# Patient Record
Sex: Female | Born: 1960 | Race: Black or African American | Hispanic: No | Marital: Single | Smoking: Former smoker
Health system: Southern US, Community
[De-identification: ages and names within clinical notes are randomized; demographics above are authoritative.]

## PROBLEM LIST (undated history)

## (undated) DIAGNOSIS — F329 Major depressive disorder, single episode, unspecified: Secondary | ICD-10-CM

## (undated) DIAGNOSIS — I10 Essential (primary) hypertension: Secondary | ICD-10-CM

## (undated) DIAGNOSIS — R002 Palpitations: Secondary | ICD-10-CM

## (undated) DIAGNOSIS — N183 Chronic kidney disease, stage 3 unspecified: Secondary | ICD-10-CM

## (undated) DIAGNOSIS — G4730 Sleep apnea, unspecified: Secondary | ICD-10-CM

## (undated) DIAGNOSIS — J45909 Unspecified asthma, uncomplicated: Secondary | ICD-10-CM

## (undated) DIAGNOSIS — E7800 Pure hypercholesterolemia, unspecified: Secondary | ICD-10-CM

## (undated) DIAGNOSIS — F32A Depression, unspecified: Secondary | ICD-10-CM

## (undated) DIAGNOSIS — J40 Bronchitis, not specified as acute or chronic: Secondary | ICD-10-CM

## (undated) HISTORY — PX: ABDOMINAL HYSTERECTOMY: SHX81

## (undated) HISTORY — PX: TONSILLECTOMY: SUR1361

---

## 2005-02-05 ENCOUNTER — Emergency Department: Payer: Self-pay | Admitting: Emergency Medicine

## 2005-02-05 ENCOUNTER — Other Ambulatory Visit: Payer: Self-pay

## 2005-03-06 ENCOUNTER — Emergency Department: Payer: Self-pay | Admitting: Emergency Medicine

## 2006-01-17 ENCOUNTER — Emergency Department: Payer: Self-pay | Admitting: Emergency Medicine

## 2006-01-18 ENCOUNTER — Ambulatory Visit: Payer: Self-pay | Admitting: Emergency Medicine

## 2006-04-16 ENCOUNTER — Emergency Department: Payer: Self-pay | Admitting: Internal Medicine

## 2008-10-30 ENCOUNTER — Inpatient Hospital Stay: Payer: Self-pay | Admitting: Internal Medicine

## 2009-05-01 ENCOUNTER — Emergency Department: Payer: Self-pay | Admitting: Emergency Medicine

## 2009-08-31 ENCOUNTER — Emergency Department: Payer: Self-pay | Admitting: Emergency Medicine

## 2010-07-04 ENCOUNTER — Emergency Department: Payer: Self-pay | Admitting: Emergency Medicine

## 2010-08-21 ENCOUNTER — Emergency Department: Payer: Self-pay | Admitting: Unknown Physician Specialty

## 2011-07-26 ENCOUNTER — Emergency Department: Payer: Self-pay | Admitting: *Deleted

## 2012-03-20 ENCOUNTER — Emergency Department: Payer: Self-pay | Admitting: Internal Medicine

## 2012-03-20 LAB — BASIC METABOLIC PANEL
Anion Gap: 11 (ref 7–16)
Creatinine: 1.55 mg/dL — ABNORMAL HIGH (ref 0.60–1.30)
EGFR (African American): 45 — ABNORMAL LOW
Glucose: 124 mg/dL — ABNORMAL HIGH (ref 65–99)
Osmolality: 289 (ref 275–301)
Potassium: 3.6 mmol/L (ref 3.5–5.1)

## 2012-03-20 LAB — CBC
HCT: 42.4 % (ref 35.0–47.0)
HGB: 13.8 g/dL (ref 12.0–16.0)
MCH: 27.7 pg (ref 26.0–34.0)
MCV: 85 fL (ref 80–100)
Platelet: 279 10*3/uL (ref 150–440)
RBC: 5 10*6/uL (ref 3.80–5.20)
RDW: 14.4 % (ref 11.5–14.5)

## 2012-11-29 ENCOUNTER — Emergency Department: Payer: Self-pay | Admitting: Emergency Medicine

## 2012-11-29 LAB — COMPREHENSIVE METABOLIC PANEL
Anion Gap: 9 (ref 7–16)
Bilirubin,Total: 0.2 mg/dL (ref 0.2–1.0)
Chloride: 106 mmol/L (ref 98–107)
Co2: 26 mmol/L (ref 21–32)
EGFR (African American): 44 — ABNORMAL LOW
EGFR (Non-African Amer.): 38 — ABNORMAL LOW
Glucose: 100 mg/dL — ABNORMAL HIGH (ref 65–99)
Potassium: 3.6 mmol/L (ref 3.5–5.1)
SGOT(AST): 35 U/L (ref 15–37)
SGPT (ALT): 29 U/L (ref 12–78)
Total Protein: 8.3 g/dL — ABNORMAL HIGH (ref 6.4–8.2)

## 2012-11-29 LAB — CBC WITH DIFFERENTIAL/PLATELET
Basophil #: 0.1 10*3/uL (ref 0.0–0.1)
Eosinophil %: 1.1 %
HCT: 46.3 % (ref 35.0–47.0)
HGB: 15.5 g/dL (ref 12.0–16.0)
Lymphocyte #: 2.7 10*3/uL (ref 1.0–3.6)
MCH: 27.8 pg (ref 26.0–34.0)
MCHC: 33.6 g/dL (ref 32.0–36.0)
MCV: 83 fL (ref 80–100)
Monocyte %: 10.6 %
RBC: 5.58 10*6/uL — ABNORMAL HIGH (ref 3.80–5.20)
WBC: 6.9 10*3/uL (ref 3.6–11.0)

## 2013-06-09 ENCOUNTER — Emergency Department: Payer: Self-pay | Admitting: Emergency Medicine

## 2013-09-04 ENCOUNTER — Emergency Department: Payer: Self-pay | Admitting: Internal Medicine

## 2013-09-04 LAB — COMPREHENSIVE METABOLIC PANEL
Alkaline Phosphatase: 89 U/L (ref 50–136)
Anion Gap: 9 (ref 7–16)
Bilirubin,Total: 0.2 mg/dL (ref 0.2–1.0)
Calcium, Total: 9.4 mg/dL (ref 8.5–10.1)
Chloride: 111 mmol/L — ABNORMAL HIGH (ref 98–107)
Co2: 21 mmol/L (ref 21–32)
Creatinine: 1.78 mg/dL — ABNORMAL HIGH (ref 0.60–1.30)
EGFR (African American): 38 — ABNORMAL LOW
Osmolality: 285 (ref 275–301)
SGOT(AST): 10 U/L — ABNORMAL LOW (ref 15–37)
Sodium: 141 mmol/L (ref 136–145)
Total Protein: 7.9 g/dL (ref 6.4–8.2)

## 2013-09-04 LAB — TROPONIN I: Troponin-I: <0.02 ng/mL

## 2013-09-04 LAB — CBC
HGB: 13.8 g/dL (ref 12.0–16.0)
MCH: 28.5 pg (ref 26.0–34.0)
Platelet: 273 10*3/uL (ref 150–440)
RBC: 4.86 10*6/uL (ref 3.80–5.20)

## 2013-09-04 LAB — ETHANOL
Ethanol %: <0.003 % (ref 0.000–0.080)
Ethanol: <3 mg/dL

## 2013-09-04 LAB — CK TOTAL AND CKMB (NOT AT ARMC): CK, Total: 97 U/L (ref 21–215)

## 2013-12-04 ENCOUNTER — Emergency Department: Payer: Self-pay | Admitting: Emergency Medicine

## 2013-12-04 LAB — TSH: THYROID STIMULATING HORM: 0.736 u[IU]/mL

## 2013-12-04 LAB — BASIC METABOLIC PANEL
Anion Gap: 10 (ref 7–16)
BUN: 39 mg/dL — ABNORMAL HIGH (ref 7–18)
CREATININE: 2.22 mg/dL — AB (ref 0.60–1.30)
Calcium, Total: 7.9 mg/dL — ABNORMAL LOW (ref 8.5–10.1)
Chloride: 113 mmol/L — ABNORMAL HIGH (ref 98–107)
Co2: 20 mmol/L — ABNORMAL LOW (ref 21–32)
EGFR (African American): 29 — ABNORMAL LOW
EGFR (Non-African Amer.): 25 — ABNORMAL LOW
Glucose: 115 mg/dL — ABNORMAL HIGH (ref 65–99)
Osmolality: 295 (ref 275–301)
Potassium: 3.5 mmol/L (ref 3.5–5.1)
SODIUM: 143 mmol/L (ref 136–145)

## 2013-12-04 LAB — CBC
HCT: 41.3 % (ref 35.0–47.0)
HGB: 13.7 g/dL (ref 12.0–16.0)
MCH: 28.4 pg (ref 26.0–34.0)
MCHC: 33.2 g/dL (ref 32.0–36.0)
MCV: 85 fL (ref 80–100)
Platelet: 244 10*3/uL (ref 150–440)
RBC: 4.83 10*6/uL (ref 3.80–5.20)
RDW: 14.2 % (ref 11.5–14.5)
WBC: 11 10*3/uL (ref 3.6–11.0)

## 2013-12-04 LAB — TROPONIN I: Troponin-I: <0.02 ng/mL

## 2013-12-14 ENCOUNTER — Emergency Department: Payer: Self-pay | Admitting: Emergency Medicine

## 2014-02-07 ENCOUNTER — Emergency Department: Payer: Self-pay | Admitting: Emergency Medicine

## 2014-02-07 LAB — COMPREHENSIVE METABOLIC PANEL
ALT: 29 U/L (ref 12–78)
AST: 21 U/L (ref 15–37)
Albumin: 3.7 g/dL (ref 3.4–5.0)
Alkaline Phosphatase: 78 U/L
Anion Gap: 3 — ABNORMAL LOW (ref 7–16)
BILIRUBIN TOTAL: 0.3 mg/dL (ref 0.2–1.0)
BUN: 18 mg/dL (ref 7–18)
CALCIUM: 9.3 mg/dL (ref 8.5–10.1)
CREATININE: 1.6 mg/dL — AB (ref 0.60–1.30)
Chloride: 111 mmol/L — ABNORMAL HIGH (ref 98–107)
Co2: 27 mmol/L (ref 21–32)
EGFR (African American): 43 — ABNORMAL LOW
GFR CALC NON AF AMER: 37 — AB
Glucose: 80 mg/dL (ref 65–99)
Osmolality: 282 (ref 275–301)
Potassium: 4 mmol/L (ref 3.5–5.1)
SODIUM: 141 mmol/L (ref 136–145)
Total Protein: 8.5 g/dL — ABNORMAL HIGH (ref 6.4–8.2)

## 2014-02-07 LAB — CBC
HCT: 41.4 % (ref 35.0–47.0)
HGB: 14 g/dL (ref 12.0–16.0)
MCH: 28.3 pg (ref 26.0–34.0)
MCHC: 33.7 g/dL (ref 32.0–36.0)
MCV: 84 fL (ref 80–100)
Platelet: 242 10*3/uL (ref 150–440)
RBC: 4.92 10*6/uL (ref 3.80–5.20)
RDW: 14.1 % (ref 11.5–14.5)
WBC: 8.9 10*3/uL (ref 3.6–11.0)

## 2014-02-07 LAB — PRO B NATRIURETIC PEPTIDE: B-Type Natriuretic Peptide: 540 pg/mL — ABNORMAL HIGH (ref 0–125)

## 2014-02-07 LAB — TROPONIN I: Troponin-I: <0.02 ng/mL

## 2014-04-24 ENCOUNTER — Emergency Department: Payer: Self-pay | Admitting: Emergency Medicine

## 2014-04-24 LAB — BASIC METABOLIC PANEL
ANION GAP: 3 — AB (ref 7–16)
BUN: 22 mg/dL — AB (ref 7–18)
CALCIUM: 9.4 mg/dL (ref 8.5–10.1)
CREATININE: 1.64 mg/dL — AB (ref 0.60–1.30)
Chloride: 109 mmol/L — ABNORMAL HIGH (ref 98–107)
Co2: 29 mmol/L (ref 21–32)
GFR CALC AF AMER: 42 — AB
GFR CALC NON AF AMER: 36 — AB
Glucose: 94 mg/dL (ref 65–99)
Osmolality: 284 (ref 275–301)
POTASSIUM: 4.1 mmol/L (ref 3.5–5.1)
Sodium: 141 mmol/L (ref 136–145)

## 2014-04-24 LAB — URINALYSIS, COMPLETE
Bacteria: NONE SEEN
Bilirubin,UR: NEGATIVE
Blood: NEGATIVE
Glucose,UR: NEGATIVE mg/dL (ref 0–75)
Ketone: NEGATIVE
Leukocyte Esterase: NEGATIVE
Nitrite: NEGATIVE
Ph: 7 (ref 4.5–8.0)
Protein: NEGATIVE
RBC,UR: <1 /HPF (ref 0–5)
Specific Gravity: 1.01 (ref 1.003–1.030)
Squamous Epithelial: 1
WBC UR: <1 /HPF (ref 0–5)

## 2014-04-24 LAB — CBC WITH DIFFERENTIAL/PLATELET
Basophil #: 0.1 10*3/uL (ref 0.0–0.1)
Basophil %: 1.1 %
Eosinophil #: 0.1 10*3/uL (ref 0.0–0.7)
Eosinophil %: 1.4 %
HCT: 41.4 % (ref 35.0–47.0)
HGB: 13.1 g/dL (ref 12.0–16.0)
Lymphocyte #: 3.1 10*3/uL (ref 1.0–3.6)
Lymphocyte %: 30.8 %
MCH: 26.6 pg (ref 26.0–34.0)
MCHC: 31.7 g/dL — ABNORMAL LOW (ref 32.0–36.0)
MCV: 84 fL (ref 80–100)
Monocyte #: 0.7 x10 3/mm (ref 0.2–0.9)
Monocyte %: 7.1 %
Neutrophil #: 5.9 10*3/uL (ref 1.4–6.5)
Neutrophil %: 59.6 %
Platelet: 281 10*3/uL (ref 150–440)
RBC: 4.92 10*6/uL (ref 3.80–5.20)
RDW: 15 % — ABNORMAL HIGH (ref 11.5–14.5)
WBC: 10 10*3/uL (ref 3.6–11.0)

## 2014-04-24 LAB — TROPONIN I: Troponin-I: <0.02 ng/mL

## 2014-06-15 ENCOUNTER — Emergency Department: Payer: Self-pay | Admitting: Emergency Medicine

## 2014-06-15 LAB — URINALYSIS, COMPLETE
Bacteria: NONE SEEN
Bilirubin,UR: NEGATIVE
Blood: NEGATIVE
GLUCOSE, UR: NEGATIVE mg/dL (ref 0–75)
Ketone: NEGATIVE
LEUKOCYTE ESTERASE: NEGATIVE
NITRITE: NEGATIVE
PH: 7 (ref 4.5–8.0)
Protein: NEGATIVE
RBC, UR: NONE SEEN /HPF (ref 0–5)
SPECIFIC GRAVITY: 1.008 (ref 1.003–1.030)
WBC UR: 1 /HPF (ref 0–5)

## 2014-06-15 LAB — CBC
HCT: 39.6 % (ref 35.0–47.0)
HGB: 12.9 g/dL (ref 12.0–16.0)
MCH: 27.8 pg (ref 26.0–34.0)
MCHC: 32.7 g/dL (ref 32.0–36.0)
MCV: 85 fL (ref 80–100)
Platelet: 278 10*3/uL (ref 150–440)
RBC: 4.66 10*6/uL (ref 3.80–5.20)
RDW: 14.9 % — AB (ref 11.5–14.5)
WBC: 6.9 10*3/uL (ref 3.6–11.0)

## 2014-06-15 LAB — COMPREHENSIVE METABOLIC PANEL
ALBUMIN: 3.2 g/dL — AB (ref 3.4–5.0)
ALK PHOS: 66 U/L
ALT: 20 U/L (ref 12–78)
ANION GAP: 6 — AB (ref 7–16)
BUN: 23 mg/dL — ABNORMAL HIGH (ref 7–18)
Bilirubin,Total: 0.4 mg/dL (ref 0.2–1.0)
CREATININE: 1.64 mg/dL — AB (ref 0.60–1.30)
Calcium, Total: 8.9 mg/dL (ref 8.5–10.1)
Chloride: 110 mmol/L — ABNORMAL HIGH (ref 98–107)
Co2: 25 mmol/L (ref 21–32)
EGFR (African American): 42 — ABNORMAL LOW
GFR CALC NON AF AMER: 36 — AB
Glucose: 86 mg/dL (ref 65–99)
Osmolality: 284 (ref 275–301)
Potassium: 4.3 mmol/L (ref 3.5–5.1)
SGOT(AST): 22 U/L (ref 15–37)
Sodium: 141 mmol/L (ref 136–145)
Total Protein: 8 g/dL (ref 6.4–8.2)

## 2014-06-15 LAB — TSH: THYROID STIMULATING HORM: 0.29 u[IU]/mL — AB

## 2014-06-15 LAB — T4, FREE: Free Thyroxine: 1.14 ng/dL (ref 0.76–1.46)

## 2014-06-15 LAB — PRO B NATRIURETIC PEPTIDE: B-Type Natriuretic Peptide: 69 pg/mL (ref 0–125)

## 2014-06-15 LAB — MAGNESIUM: Magnesium: 1.9 mg/dL

## 2014-06-15 LAB — TROPONIN I: Troponin-I: <0.02 ng/mL

## 2015-07-24 ENCOUNTER — Encounter: Payer: Self-pay | Admitting: Emergency Medicine

## 2015-07-24 ENCOUNTER — Emergency Department
Admission: EM | Admit: 2015-07-24 | Discharge: 2015-07-24 | Disposition: A | Discharge status: Home / Self Care | Payer: Medicaid Other | Attending: Emergency Medicine | Admitting: Emergency Medicine

## 2015-07-24 DIAGNOSIS — R040 Epistaxis: Secondary | ICD-10-CM | POA: Insufficient documentation

## 2015-07-24 DIAGNOSIS — Z87891 Personal history of nicotine dependence: Secondary | ICD-10-CM | POA: Diagnosis not present

## 2015-07-24 DIAGNOSIS — I10 Essential (primary) hypertension: Secondary | ICD-10-CM | POA: Diagnosis not present

## 2015-07-24 HISTORY — DX: Essential (primary) hypertension: I10

## 2015-07-24 HISTORY — DX: Sleep apnea, unspecified: G47.30

## 2015-07-24 NOTE — ED Notes (Addendum)
Pt reports cooking various foods in her kitchen when her right nostril began to bleed. Pt denies experiencing lightheadedness, dizziness or h/a. Pt reports experiencing "funny feeling in my chest". Pt reports hx of hypertension. Pt reports taking simvastatin and an ace inhibitor regularly for hypertension. Pt denies injury or trauma to nose. Pt denies loss of consciousness. Pt denies pain at this time.

## 2015-07-24 NOTE — ED Notes (Signed)
No injury, states takes aspirin a day, bleeding from R nare

## 2015-07-24 NOTE — ED Notes (Signed)
Right Nostril bleeding has stopped at this time. Pt reports minor bleeding from both nostrils 2 weeks ago, but did not come to the hospital because bleeding had stopped at home.

## 2015-07-24 NOTE — Discharge Instructions (Signed)
Nosebleed °A nosebleed can be caused by many things, including: °· Getting hit hard in the nose. °· Infections. °· Dry nose. °· Colds. °· Medicines. °Your doctor may do lab testing if you get nosebleeds a lot and the cause is not known. °HOME CARE  °· If your nose was packed with material, keep it there until your doctor takes it out. Put the pack back in your nose if the pack falls out. °· Do not blow your nose for 12 hours after the nosebleed. °· Sit up and bend forward if your nose starts bleeding again. Pinch the front half of your nose nonstop for 20 minutes. °· Put petroleum jelly inside your nose every morning if you have a dry nose. °· Use a humidifier to make the air less dry. °· Do not take aspirin. °· Try not to strain, lift, or bend at the waist for many days after the nosebleed. °GET HELP RIGHT AWAY IF:  °· Nosebleeds keep happening and are hard to stop or control. °· You have bleeding or bruises that are not normal on other parts of the body. °· You have a fever. °· The nosebleeds get worse. °· You get lightheaded, feel faint, sweaty, or throw up (vomit) blood. °MAKE SURE YOU:  °· Understand these instructions. °· Will watch your condition. °· Will get help right away if you are not doing well or get worse. °Document Released: 08/29/2008 Document Revised: 02/12/2012 Document Reviewed: 08/29/2008 °ExitCare® Patient Information ©2015 [employer]. This information is not intended to replace advice given to you by your health care provider. Make sure you discuss any questions you have with your health care provider. ° °

## 2015-07-24 NOTE — ED Provider Notes (Signed)
CSN: [id number]     Arrival date & time 07/24/15  1440 History   First MD Initiated Contact with Patient 07/24/15 1554     Chief Complaint  Patient presents with  . Epistaxis    began today     (Consider location/radiation/quality/duration/timing/severity/associated sxs/prior Treatment) HPI  54 year old female presents today for evaluation of right-sided epistasis. Bleeding began just prior to arrival. Patient states she was cooking, got hot and noticed trickling of blood down her face. Bleeding lasted for minutes. She was able to get complete relief with compression. She has had several nosebleeds over the last few months but usually they only last 2 minutes. She denies any headaches, blurred vision, abdominal pain, nausea, vomiting. She denies any trauma to the face. She denies any cough cold congestion runny nose. Patient is doing well currently and able to breathe in and out the nose without discomfort. Past Medical History  Diagnosis Date  . Hypertension   . Sleep apnea    Past Surgical History  Procedure Laterality Date  . Abdominal hysterectomy     No family history on file. Social History  Substance Use Topics  . Smoking status: Former Games developer  . Smokeless tobacco: None  . Alcohol Use: No   OB History    No data available     Review of Systems  Constitutional: Negative for fever, chills, activity change and fatigue.  HENT: Positive for nosebleeds. Negative for congestion, sinus pressure and sore throat.   Eyes: Negative for visual disturbance.  Respiratory: Negative for cough, chest tightness and shortness of breath.   Cardiovascular: Negative for chest pain and leg swelling.  Gastrointestinal: Negative for nausea, vomiting, abdominal pain and diarrhea.  Genitourinary: Negative for dysuria.  Musculoskeletal: Negative for arthralgias and gait problem.  Skin: Negative for rash.  Neurological: Negative for weakness, numbness and headaches.  Hematological: Negative for  adenopathy.  Psychiatric/Behavioral: Negative for behavioral problems, confusion and agitation.      Allergies  Review of patient's allergies indicates no known allergies.  Home Medications   Prior to Admission medications   Not on File   BP 138/86 mmHg  Pulse 110  Temp(Src) 98.1 F (36.7 C) (Oral)  Resp 20  Ht 5\' 4"  (1.626 m)  Wt 273 lb (123.832 kg)  BMI 46.84 kg/m2  SpO2 98%  LMP  Physical Exam  Constitutional: She is oriented to person, place, and time. She appears well-developed and well-nourished. No distress.  HENT:  Head: Normocephalic and atraumatic.  Nose: No mucosal edema, rhinorrhea, nose lacerations, sinus tenderness, nasal deformity, septal deviation or nasal septal hematoma. No epistaxis (minimal dried blood in the right nare).  No foreign bodies. Right sinus exhibits no maxillary sinus tenderness and no frontal sinus tenderness. Left sinus exhibits no maxillary sinus tenderness and no frontal sinus tenderness.  Mouth/Throat: Oropharynx is clear and moist. No oral lesions. No uvula swelling. No posterior oropharyngeal edema or posterior oropharyngeal erythema.  No bleeding in the pharyngeal region.  Eyes: EOM are normal. Pupils are equal, round, and reactive to light. Right eye exhibits no discharge. Left eye exhibits no discharge.  Neck: Normal range of motion. Neck supple.  Cardiovascular: Normal rate, regular rhythm and intact distal pulses.   Pulmonary/Chest: Effort normal and breath sounds normal. No respiratory distress. She exhibits no tenderness.  Abdominal: Soft. She exhibits no distension. There is no tenderness.  Musculoskeletal: Normal range of motion. She exhibits no edema.  Neurological: She is alert and oriented to person, place, and time. She  has normal reflexes.  Skin: Skin is warm and dry.  Psychiatric: She has a normal mood and affect. Her behavior is normal. Thought content normal.    ED Course  Procedures (including critical care  time) Labs Review Labs Reviewed - No data to display  Imaging Review No results found. I have personally reviewed and evaluated these images and lab results as part of my medical decision-making.   EKG Interpretation None      MDM   Final diagnoses:  Right-sided nosebleed    54 year old female right-sided epistasis. She was educated on nosebleeds, treatment and, red flags to return to the ER for. She was given a nasal clamp to use at home if bleeding occurs.    Evon Slack, PA-C 07/24/15 1623  Loleta Rose, MD 07/25/15 0005

## 2018-03-28 ENCOUNTER — Emergency Department (HOSPITAL_COMMUNITY): Payer: Medicaid Other

## 2018-03-28 ENCOUNTER — Emergency Department (HOSPITAL_COMMUNITY)
Admission: EM | Admit: 2018-03-28 | Discharge: 2018-03-28 | Discharge status: Against Medical Advice | Payer: Medicaid Other | Attending: Emergency Medicine | Admitting: Emergency Medicine

## 2018-03-28 ENCOUNTER — Encounter (HOSPITAL_COMMUNITY): Payer: Self-pay | Admitting: Emergency Medicine

## 2018-03-28 DIAGNOSIS — Z5321 Procedure and treatment not carried out due to patient leaving prior to being seen by health care provider: Secondary | ICD-10-CM | POA: Diagnosis not present

## 2018-03-28 DIAGNOSIS — R0602 Shortness of breath: Secondary | ICD-10-CM | POA: Diagnosis present

## 2018-03-28 LAB — CBC
HCT: 43.2 % (ref 36.0–46.0)
HEMOGLOBIN: 14.4 g/dL (ref 12.0–15.0)
MCH: 28.7 pg (ref 26.0–34.0)
MCHC: 33.3 g/dL (ref 30.0–36.0)
MCV: 86.2 fL (ref 78.0–100.0)
Platelets: 264 10*3/uL (ref 150–400)
RBC: 5.01 MIL/uL (ref 3.87–5.11)
RDW: 14.5 % (ref 11.5–15.5)
WBC: 6.2 10*3/uL (ref 4.0–10.5)

## 2018-03-28 LAB — I-STAT TROPONIN, ED: TROPONIN I, POC: 0 ng/mL (ref 0.00–0.08)

## 2018-03-28 LAB — BASIC METABOLIC PANEL
ANION GAP: 8 (ref 5–15)
BUN: 21 mg/dL — ABNORMAL HIGH (ref 6–20)
CALCIUM: 9.5 mg/dL (ref 8.9–10.3)
CO2: 25 mmol/L (ref 22–32)
Chloride: 107 mmol/L (ref 101–111)
Creatinine, Ser: 1.49 mg/dL — ABNORMAL HIGH (ref 0.44–1.00)
GFR calc Af Amer: 45 mL/min — ABNORMAL LOW (ref >60)
GFR, EST NON AFRICAN AMERICAN: 39 mL/min — AB (ref >60)
GLUCOSE: 96 mg/dL (ref 65–99)
POTASSIUM: 4.1 mmol/L (ref 3.5–5.1)
Sodium: 140 mmol/L (ref 135–145)

## 2018-03-28 LAB — I-STAT BETA HCG BLOOD, ED (MC, WL, AP ONLY): I-stat hCG, quantitative: <5 m[IU]/mL (ref <5)

## 2018-03-28 NOTE — ED Triage Notes (Signed)
Per [facility] pt from home where she has been out of power for 4 days. At night sleeps with cpap and hasnt been able to. Reporting SOB and anxiety since. Vitals: 136/90, 98HR, 98% on room

## 2018-03-28 NOTE — ED Notes (Signed)
[phone] [phone]

## 2018-03-28 NOTE — ED Notes (Signed)
Pt had drawn for labs:  Red Gold Blue Lavender Lt green Dark green x2 

## 2018-08-21 ENCOUNTER — Encounter (HOSPITAL_COMMUNITY): Payer: Self-pay | Admitting: Emergency Medicine

## 2018-08-21 ENCOUNTER — Other Ambulatory Visit: Payer: Self-pay

## 2018-08-21 ENCOUNTER — Observation Stay (HOSPITAL_COMMUNITY)
Admission: EM | Admit: 2018-08-21 | Discharge: 2018-08-22 | Disposition: A | Discharge status: Home / Self Care | Payer: Medicaid Other | Attending: Internal Medicine | Admitting: Internal Medicine

## 2018-08-21 ENCOUNTER — Emergency Department (HOSPITAL_COMMUNITY): Payer: Medicaid Other

## 2018-08-21 DIAGNOSIS — G4733 Obstructive sleep apnea (adult) (pediatric): Secondary | ICD-10-CM | POA: Insufficient documentation

## 2018-08-21 DIAGNOSIS — J45909 Unspecified asthma, uncomplicated: Secondary | ICD-10-CM | POA: Diagnosis not present

## 2018-08-21 DIAGNOSIS — Z79899 Other long term (current) drug therapy: Secondary | ICD-10-CM | POA: Diagnosis not present

## 2018-08-21 DIAGNOSIS — I129 Hypertensive chronic kidney disease with stage 1 through stage 4 chronic kidney disease, or unspecified chronic kidney disease: Secondary | ICD-10-CM | POA: Insufficient documentation

## 2018-08-21 DIAGNOSIS — M109 Gout, unspecified: Secondary | ICD-10-CM | POA: Diagnosis not present

## 2018-08-21 DIAGNOSIS — E785 Hyperlipidemia, unspecified: Secondary | ICD-10-CM | POA: Insufficient documentation

## 2018-08-21 DIAGNOSIS — E669 Obesity, unspecified: Secondary | ICD-10-CM | POA: Diagnosis not present

## 2018-08-21 DIAGNOSIS — E870 Hyperosmolality and hypernatremia: Secondary | ICD-10-CM | POA: Diagnosis not present

## 2018-08-21 DIAGNOSIS — I10 Essential (primary) hypertension: Secondary | ICD-10-CM

## 2018-08-21 DIAGNOSIS — Z87891 Personal history of nicotine dependence: Secondary | ICD-10-CM | POA: Insufficient documentation

## 2018-08-21 DIAGNOSIS — Z6841 Body Mass Index (BMI) 40.0 and over, adult: Secondary | ICD-10-CM | POA: Insufficient documentation

## 2018-08-21 DIAGNOSIS — F329 Major depressive disorder, single episode, unspecified: Secondary | ICD-10-CM | POA: Diagnosis not present

## 2018-08-21 DIAGNOSIS — E7800 Pure hypercholesterolemia, unspecified: Secondary | ICD-10-CM | POA: Insufficient documentation

## 2018-08-21 DIAGNOSIS — E1122 Type 2 diabetes mellitus with diabetic chronic kidney disease: Secondary | ICD-10-CM | POA: Insufficient documentation

## 2018-08-21 DIAGNOSIS — Z7982 Long term (current) use of aspirin: Secondary | ICD-10-CM | POA: Insufficient documentation

## 2018-08-21 DIAGNOSIS — E1169 Type 2 diabetes mellitus with other specified complication: Secondary | ICD-10-CM | POA: Diagnosis not present

## 2018-08-21 DIAGNOSIS — R079 Chest pain, unspecified: Principal | ICD-10-CM | POA: Diagnosis present

## 2018-08-21 DIAGNOSIS — Z9989 Dependence on other enabling machines and devices: Secondary | ICD-10-CM | POA: Diagnosis not present

## 2018-08-21 DIAGNOSIS — N183 Chronic kidney disease, stage 3 (moderate): Secondary | ICD-10-CM | POA: Insufficient documentation

## 2018-08-21 HISTORY — DX: Pure hypercholesterolemia, unspecified: E78.00

## 2018-08-21 HISTORY — DX: Unspecified asthma, uncomplicated: J45.909

## 2018-08-21 HISTORY — DX: Major depressive disorder, single episode, unspecified: F32.9

## 2018-08-21 HISTORY — DX: Bronchitis, not specified as acute or chronic: J40

## 2018-08-21 HISTORY — DX: Chronic kidney disease, stage 3 unspecified: N18.30

## 2018-08-21 HISTORY — DX: Depression, unspecified: F32.A

## 2018-08-21 HISTORY — DX: Morbid (severe) obesity due to excess calories: E66.01

## 2018-08-21 HISTORY — DX: Palpitations: R00.2

## 2018-08-21 HISTORY — DX: Chronic kidney disease, stage 3 (moderate): N18.3

## 2018-08-21 LAB — CBC
HCT: 41.4 % (ref 36.0–46.0)
HCT: 38.6 % (ref 36.0–46.0)
HEMOGLOBIN: 12.2 g/dL (ref 12.0–15.0)
Hemoglobin: 12.6 g/dL (ref 12.0–15.0)
MCH: 28.3 pg (ref 26.0–34.0)
MCH: 28.2 pg (ref 26.0–34.0)
MCHC: 30.4 g/dL (ref 30.0–36.0)
MCHC: 31.6 g/dL (ref 30.0–36.0)
MCV: 93 fL (ref 78.0–100.0)
MCV: 89.4 fL (ref 78.0–100.0)
Platelets: 202 10*3/uL (ref 150–400)
Platelets: 212 10*3/uL (ref 150–400)
RBC: 4.45 MIL/uL (ref 3.87–5.11)
RBC: 4.32 MIL/uL (ref 3.87–5.11)
RDW: 15 % (ref 11.5–15.5)
RDW: 14.8 % (ref 11.5–15.5)
WBC: 7.4 10*3/uL (ref 4.0–10.5)
WBC: 7 10*3/uL (ref 4.0–10.5)

## 2018-08-21 LAB — I-STAT TROPONIN, ED: TROPONIN I, POC: 0 ng/mL (ref 0.00–0.08)

## 2018-08-21 LAB — TROPONIN I: Troponin I: <0.03 ng/mL (ref <0.03)

## 2018-08-21 LAB — COMPREHENSIVE METABOLIC PANEL
ALK PHOS: 48 U/L (ref 38–126)
ALT: 17 U/L (ref 0–44)
AST: 15 U/L (ref 15–41)
Albumin: 3.3 g/dL — ABNORMAL LOW (ref 3.5–5.0)
Anion gap: 11 (ref 5–15)
BUN: 13 mg/dL (ref 6–20)
CALCIUM: 9.1 mg/dL (ref 8.9–10.3)
CO2: 20 mmol/L — AB (ref 22–32)
CREATININE: 1.4 mg/dL — AB (ref 0.44–1.00)
Chloride: 115 mmol/L — ABNORMAL HIGH (ref 98–111)
GFR calc non Af Amer: 42 mL/min — ABNORMAL LOW (ref >60)
GFR, EST AFRICAN AMERICAN: 48 mL/min — AB (ref >60)
Glucose, Bld: 80 mg/dL (ref 70–99)
Potassium: 3.8 mmol/L (ref 3.5–5.1)
SODIUM: 146 mmol/L — AB (ref 135–145)
Total Bilirubin: 0.3 mg/dL (ref 0.3–1.2)
Total Protein: 6.6 g/dL (ref 6.5–8.1)

## 2018-08-21 LAB — GLUCOSE, CAPILLARY: Glucose-Capillary: 93 mg/dL (ref 70–99)

## 2018-08-21 LAB — I-STAT BETA HCG BLOOD, ED (MC, WL, AP ONLY): I-stat hCG, quantitative: <5 m[IU]/mL (ref <5)

## 2018-08-21 LAB — CBG MONITORING, ED: Glucose-Capillary: 84 mg/dL (ref 70–99)

## 2018-08-21 MED ORDER — ASPIRIN 81 MG PO CHEW: 324.0000 mg | CHEWABLE_TABLET | Freq: Once | ORAL | Status: DC

## 2018-08-21 MED ORDER — ALLOPURINOL 300 MG PO TABS
300.0000 mg | ORAL_TABLET | Freq: Every day | ORAL | Status: DC
  Administered 2018-08-22: 300 mg via ORAL
  Filled 2018-08-21: qty 1

## 2018-08-21 MED ORDER — POLYETHYLENE GLYCOL 3350 17 G PO PACK: 17.0000 g | PACK | Freq: Every day | ORAL | Status: DC | PRN

## 2018-08-21 MED ORDER — ONDANSETRON HCL 4 MG/2ML IJ SOLN
4.0000 mg | Freq: Four times a day (QID) | INTRAMUSCULAR | Status: DC | PRN
  Administered 2018-08-22: 4 mg via INTRAVENOUS
  Filled 2018-08-21: qty 2

## 2018-08-21 MED ORDER — FAMOTIDINE 20 MG PO TABS
20.0000 mg | ORAL_TABLET | Freq: Every day | ORAL | Status: DC
  Administered 2018-08-22: 20 mg via ORAL
  Filled 2018-08-21: qty 1

## 2018-08-21 MED ORDER — COLCHICINE 0.6 MG PO TABS
0.6000 mg | ORAL_TABLET | Freq: Every day | ORAL | Status: DC
  Administered 2018-08-22: 0.6 mg via ORAL
  Filled 2018-08-21: qty 1

## 2018-08-21 MED ORDER — ENALAPRIL MALEATE 10 MG PO TABS
10.0000 mg | ORAL_TABLET | Freq: Every day | ORAL | Status: DC
  Administered 2018-08-22: 10 mg via ORAL
  Filled 2018-08-21: qty 1

## 2018-08-21 MED ORDER — ACETAMINOPHEN 325 MG PO TABS
650.0000 mg | ORAL_TABLET | ORAL | Status: DC | PRN
  Administered 2018-08-21 – 2018-08-22 (×3): 650 mg via ORAL
  Filled 2018-08-21 (×3): qty 2

## 2018-08-21 MED ORDER — ASPIRIN EC 81 MG PO TBEC
81.0000 mg | DELAYED_RELEASE_TABLET | Freq: Every day | ORAL | Status: DC
  Administered 2018-08-22: 81 mg via ORAL
  Filled 2018-08-21: qty 1

## 2018-08-21 MED ORDER — METOPROLOL TARTRATE 25 MG PO TABS
50.0000 mg | ORAL_TABLET | Freq: Two times a day (BID) | ORAL | Status: DC
  Administered 2018-08-22: 50 mg via ORAL
  Filled 2018-08-21: qty 2

## 2018-08-21 MED ORDER — CITALOPRAM HYDROBROMIDE 10 MG PO TABS
40.0000 mg | ORAL_TABLET | Freq: Every day | ORAL | Status: DC
  Administered 2018-08-22: 40 mg via ORAL
  Filled 2018-08-21: qty 4

## 2018-08-21 MED ORDER — ENOXAPARIN SODIUM 40 MG/0.4ML ~~LOC~~ SOLN
40.0000 mg | Freq: Every day | SUBCUTANEOUS | Status: DC
  Administered 2018-08-21: 40 mg via SUBCUTANEOUS
  Filled 2018-08-21: qty 0.4

## 2018-08-21 MED ORDER — AMLODIPINE BESYLATE 5 MG PO TABS
5.0000 mg | ORAL_TABLET | Freq: Every day | ORAL | Status: DC
  Administered 2018-08-22: 5 mg via ORAL
  Filled 2018-08-21: qty 1

## 2018-08-21 MED ORDER — BUPROPION HCL ER (XL) 150 MG PO TB24
150.0000 mg | ORAL_TABLET | Freq: Every day | ORAL | Status: DC
  Administered 2018-08-22: 150 mg via ORAL
  Filled 2018-08-21: qty 1

## 2018-08-21 MED ORDER — INSULIN ASPART 100 UNIT/ML ~~LOC~~ SOLN: 0.0000 [IU] | Freq: Three times a day (TID) | SUBCUTANEOUS | Status: DC

## 2018-08-21 MED ORDER — ALBUTEROL SULFATE (2.5 MG/3ML) 0.083% IN NEBU: 3.0000 mL | INHALATION_SOLUTION | Freq: Four times a day (QID) | RESPIRATORY_TRACT | Status: DC | PRN

## 2018-08-21 MED ORDER — SIMVASTATIN 20 MG PO TABS
20.0000 mg | ORAL_TABLET | Freq: Every day | ORAL | Status: DC
  Administered 2018-08-22: 20 mg via ORAL
  Filled 2018-08-21: qty 1

## 2018-08-21 NOTE — Progress Notes (Signed)
Patient placed on CPAP for HS using FFM and 6.5 cmH20 as per patient's home regiment.  Room air.  Patient tolerated well.

## 2018-08-21 NOTE — ED Provider Notes (Signed)
Emergency Department Provider Note   I have reviewed the triage vital signs and the nursing notes.   HISTORY  Chief Complaint Chest Pain   HPI Phyllis MatinDeborah Miller is a 57 y.o. female with history of hypertension chin, hyperlipidemia and gout who presents the emergency department today with chest pain.  Patient states for the last 3 or 4 days she is had intermittent episodes lasting approximate 5 or 6 minutes of left-sided and central chest pressure radiates up to both shoulders and down her right arm.  Also states that she has some mild lightheadedness with this but no dyspnea, diaphoresis or vomiting.  No recent trauma.  No fevers but does have a nonproductive cough.  She does endorse some mild bilateral lower extremity edema since this started.  It is not asymmetric.  No history of blood clots.  She had symptoms like this before but not to this frequency was having multiple episodes a day.  Took aspirin prior to coming seem to offer some relief. No other associated or modifying symptoms.    Past Medical History:  Diagnosis Date  . Asthma   . Bronchitis   . Heart palpitations   . Hypercholesteremia   . Hypertension   . Sleep apnea     Patient Active Problem List   Diagnosis Date Noted  . Chest pain 08/21/2018  . Essential hypertension 08/21/2018  . Hyperlipidemia 08/21/2018  . Diabetes mellitus type 2 in obese (HCC) 08/21/2018    Past Surgical History:  Procedure Laterality Date  . ABDOMINAL HYSTERECTOMY    . TONSILLECTOMY        Allergies Patient has no known allergies.  Family History  Problem Relation Age of Onset  . CAD Brother     Social History Social History   Tobacco Use  . Smoking status: Former Smoker    Types: Cigarettes    Last attempt to quit: 07/19/2013    Years since quitting: 5.0  . Smokeless tobacco: Never Used  Substance Use Topics  . Alcohol use: No  . Drug use: Never    Review of Systems  All other systems negative except as  documented in the HPI. All pertinent positives and negatives as reviewed in the HPI. ____________________________________________   PHYSICAL EXAM:  VITAL SIGNS: Blood pressure (!) 122/57, pulse 79, temperature 98.1 F (36.7 C), temperature source Oral, resp. rate 16, SpO2 100 %.  Constitutional: Alert and oriented. Well appearing and in no acute distress. Eyes: Conjunctivae are normal. PERRL. EOMI. Head: Atraumatic. Nose: No congestion/rhinnorhea. Mouth/Throat: Mucous membranes are moist.  Oropharynx non-erythematous. Neck: No stridor.  No meningeal signs.   Cardiovascular: Normal rate, regular rhythm. Good peripheral circulation. Grossly normal heart sounds.   Respiratory: Normal respiratory effort.  No retractions. Lungs CTAB. Gastrointestinal: Soft and nontender. No distention.  Musculoskeletal: No lower extremity tenderness nor edema. No gross deformities of extremities. Neurologic:  Normal speech and language. No gross focal neurologic deficits are appreciated.  Skin:  Skin is warm, dry and intact. No rash noted.   ____________________________________________   LABS (all labs ordered are listed, but only abnormal results are displayed)  Labs Reviewed  COMPREHENSIVE METABOLIC PANEL - Abnormal; Notable for the following components:      Result Value   Sodium 146 (*)    Chloride 115 (*)    CO2 20 (*)    Creatinine, Ser 1.40 (*)    Albumin 3.3 (*)    GFR calc non Af Amer 42 (*)    GFR calc Af  Amer 48 (*)    All other components within normal limits  CREATININE, SERUM - Abnormal; Notable for the following components:   Creatinine, Ser 1.44 (*)    GFR calc non Af Amer 40 (*)    GFR calc Af Amer 47 (*)    All other components within normal limits  CBC  TROPONIN I  CBC  TROPONIN I  GLUCOSE, CAPILLARY  HIV ANTIBODY (ROUTINE TESTING W REFLEX)  TROPONIN I  TROPONIN I  I-STAT TROPONIN, ED  I-STAT BETA HCG BLOOD, ED ([facility], [facility], [facility] ONLY)  CBG MONITORING, ED  I-STAT BETA  HCG BLOOD, ED ([facility], [facility], [facility] ONLY)   ____________________________________________  EKG   EKG Interpretation  Date/Time:  Wednesday August 21 2018 15:37:34 EDT Ventricular Rate:  77 PR Interval:  158 QRS Duration: 72 QT Interval:  386 QTC Calculation: 436 R Axis:   44 Text Interpretation:  Normal sinus rhythm Normal ECG nonspecific ST changes and Q waves in inferior leads similar to 4/19 Confirmed by Marily Memos [phone]) on 08/21/2018 3:48:02 PM       ____________________________________________  RADIOLOGY  Dg Chest 2 View  Result Date: 08/21/2018 CLINICAL DATA:  Chest pain for 4 days. EXAM: CHEST - 2 VIEW COMPARISON:  03/28/2018 FINDINGS: The cardiac silhouette, mediastinal and hilar contours are within normal limits and stable. Stable right-sided epicardial fat pad. The lungs are clear. No pleural effusion. The bony thorax is intact. IMPRESSION: No acute cardiopulmonary findings. Electronically Signed   By: Rudie Meyer M.D.   On: 08/21/2018 17:13    ____________________________________________   INITIAL IMPRESSION / ASSESSMENT AND PLAN / ED COURSE  Patient with a heart score for so needs further work-up for ACS.  Low suspicion for PE without shortness of breath, tachycardia or tachypnea.  Low suspicion for dissection, pneumonia, pneumothorax or other acute causes for symptoms at this time.  Plan for hospitalist admission     Pertinent labs & imaging results that were available during my care of the patient were reviewed by me and considered in my medical decision making (see chart for details).  ____________________________________________  FINAL CLINICAL IMPRESSION(S) / ED DIAGNOSES  Final diagnoses:  None     MEDICATIONS GIVEN DURING THIS VISIT:  Medications  aspirin chewable tablet 324 mg (324 mg Oral Not Given 08/21/18 1619)  allopurinol (ZYLOPRIM) tablet 300 mg (has no administration in time range)  aspirin EC tablet 81 mg (has no administration in time  range)  colchicine tablet 0.6 mg (has no administration in time range)  amLODipine (NORVASC) tablet 5 mg (has no administration in time range)  enalapril (VASOTEC) tablet 10 mg (has no administration in time range)  metoprolol tartrate (LOPRESSOR) tablet 50 mg (has no administration in time range)  simvastatin (ZOCOR) tablet 20 mg (has no administration in time range)  buPROPion (WELLBUTRIN XL) 24 hr tablet 150 mg (has no administration in time range)  citalopram (CELEXA) tablet 40 mg (has no administration in time range)  famotidine (PEPCID) tablet 20 mg (has no administration in time range)  polyethylene glycol (MIRALAX / GLYCOLAX) packet 17 g (has no administration in time range)  albuterol (PROVENTIL) (2.5 MG/3ML) 0.083% nebulizer solution 3 mL (has no administration in time range)  acetaminophen (TYLENOL) tablet 650 mg (650 mg Oral Given 08/21/18 2342)  ondansetron (ZOFRAN) injection 4 mg (has no administration in time range)  insulin aspart (novoLOG) injection 0-9 Units (has no administration in time range)  enoxaparin (LOVENOX) injection 40 mg (40 mg Subcutaneous Given 08/21/18  2230)     NEW OUTPATIENT MEDICATIONS STARTED DURING THIS VISIT:  Current Discharge Medication List      Note:  This note was prepared with assistance of Dragon voice recognition software. Occasional wrong-word or sound-a-like substitutions may have occurred due to the inherent limitations of voice recognition software.   Anyla Israelson, Barbara Cower, MD 08/22/18 [phone]

## 2018-08-21 NOTE — ED Triage Notes (Signed)
Pt complains of central CP. Hx of GERD. Pt took 324mg  ASA with some relief. Bp 117/89, HR 70, resp 16. CBG 100

## 2018-08-21 NOTE — Progress Notes (Signed)
Admitting MD Kakrakandy at bedside.  

## 2018-08-21 NOTE — H&P (Signed)
History and Physical    Phyllis Miller ZOX:[id number] DOB: [birth year] DOA: 08/21/2018  PCP: Earnestine LeysKizer, John S, MD  Patient coming from: Home.  Chief Complaint: Chest pain.  HPI: Phyllis Miller is a 57 y.o. female with history of diabetes mellitus type 2 on diet, sleep apnea, morbid obesity, hypertension, asthma, hyperlipidemia, chronic kidney disease stage III presents to the ER with complaints of chest pain.  Patient states over the last 3 to 4 days patient has been having chest pain.  Pain is across the chest squeezing in nature nonradiating no relation to exertion.  Denies any associated shortness of breath nausea vomiting diaphoresis or palpitations.  No definite aggravating or relieving factors.  ED Course: In the ER patient was chest pain-free chest x-ray was unremarkable EKG was showing normal sinus rhythm.  Patient has been admitted for further chest pain work-up.  Review of Systems: As per HPI, rest all negative.   Past Medical History:  Diagnosis Date  . Asthma   . Bronchitis   . Heart palpitations   . Hypercholesteremia   . Hypertension   . Sleep apnea     Past Surgical History:  Procedure Laterality Date  . ABDOMINAL HYSTERECTOMY    . TONSILLECTOMY       reports that she quit smoking about 5 years ago. Her smoking use included cigarettes. She has never used smokeless tobacco. She reports that she does not drink alcohol or use drugs.  No Known Allergies  Family History  Problem Relation Age of Onset  . CAD Brother     Prior to Admission medications   Medication Sig Start Date End Date Taking? Authorizing Provider  albuterol (PROAIR HFA) 108 (90 Base) MCG/ACT inhaler Inhale 2 puffs into the lungs every 6 (six) hours as needed for wheezing or shortness of breath.  02/29/16 07/04/19 Yes [provider]  allopurinol (ZYLOPRIM) 300 MG tablet Take 300 mg by mouth daily.  07/29/18 07/29/19 Yes [provider]  amLODipine (NORVASC) 5 MG tablet Take 5 mg  by mouth daily.  05/03/16 07/29/19 Yes [provider]  aspirin EC 81 MG tablet Take 81 mg by mouth daily.    Yes [provider]  buPROPion (WELLBUTRIN XL) 150 MG 24 hr tablet Take 150 mg by mouth daily.    Yes [provider]  citalopram (CELEXA) 40 MG tablet Take 40 mg by mouth daily.  08/26/10  Yes [provider]  Colchicine 0.6 MG CAPS Take 0.6 mg by mouth daily.  02/05/18  Yes [provider]  enalapril (VASOTEC) 10 MG tablet Take 10 mg by mouth daily.  04/13/16  Yes [provider]  famotidine (PEPCID) 20 MG tablet Take 20 mg by mouth daily.  08/20/12  Yes [provider]  metoprolol tartrate (LOPRESSOR) 50 MG tablet Take 50 mg by mouth 2 (two) times daily.  05/31/18 05/31/19 Yes [provider]  polyethylene glycol (MIRALAX / GLYCOLAX) packet Take 17 g by mouth daily as needed for moderate constipation.  08/30/17  Yes [provider]  simvastatin (ZOCOR) 20 MG tablet Take 20 mg by mouth daily.  01/04/16 07/29/19 Yes [provider]    Physical Exam: Vitals:   08/21/18 1547 08/21/18 1938 08/21/18 2028 08/21/18 2150  BP: (!) 122/57 126/68 133/75 99/83  Pulse: 79 78 78 78  Resp: 16 16 16 18   Temp: 98.1 F (36.7 C)     TempSrc: Oral     SpO2: 100% 99% 99% 99%  Constitutional: Moderately built and nourished. Vitals:   08/21/18 1547 08/21/18 1938 08/21/18 2028 08/21/18 2150  BP: (!) 122/57 126/68 133/75 99/83  Pulse: 79 78 78 78  Resp: 16 16 16 18   Temp: 98.1 F (36.7 C)     TempSrc: Oral     SpO2: 100% 99% 99% 99%   Eyes: Anicteric no pallor. ENMT: No discharge from the ears eyes nose or mouth. Neck: No mass palpated no neck rigidity no JVD appreciated. Respiratory: No rhonchi or crepitations. Cardiovascular: S1-S2 heard no murmurs appreciated. Abdomen: Soft nontender bowel sounds present. Musculoskeletal: No edema.  No joint effusion. Skin: No rash. Neurologic: Alert awake oriented to  time place and person.  Moves all extremities. Psychiatric: Appears normal per normal affect.   Labs on Admission: I have personally reviewed following labs and imaging studies  CBC: Recent Labs  Lab 08/21/18 1705  WBC 7.4  HGB 12.6  HCT 41.4  MCV 93.0  PLT 202   Basic Metabolic Panel: Recent Labs  Lab 08/21/18 1705  NA 146*  K 3.8  CL 115*  CO2 20*  GLUCOSE 80  BUN 13  CREATININE 1.40*  CALCIUM 9.1   GFR: CrCl cannot be calculated (Unknown ideal weight.). Liver Function Tests: Recent Labs  Lab 08/21/18 1705  AST 15  ALT 17  ALKPHOS 48  BILITOT 0.3  PROT 6.6  ALBUMIN 3.3*   No results for input(s): LIPASE, AMYLASE in the last 168 hours. No results for input(s): AMMONIA in the last 168 hours. Coagulation Profile: No results for input(s): INR, PROTIME in the last 168 hours. Cardiac Enzymes: Recent Labs  Lab 08/21/18 1705  TROPONINI <0.03   BNP (last 3 results) No results for input(s): PROBNP in the last 8760 hours. HbA1C: No results for input(s): HGBA1C in the last 72 hours. CBG: Recent Labs  Lab 08/21/18 1611  GLUCAP 84   Lipid Profile: No results for input(s): CHOL, HDL, LDLCALC, TRIG, CHOLHDL, LDLDIRECT in the last 72 hours. Thyroid Function Tests: No results for input(s): TSH, T4TOTAL, FREET4, T3FREE, THYROIDAB in the last 72 hours. Anemia Panel: No results for input(s): VITAMINB12, FOLATE, FERRITIN, TIBC, IRON, RETICCTPCT in the last 72 hours. Urine analysis:    Component Value Date/Time   COLORURINE Straw 06/15/2014 1359   APPEARANCEUR Clear 06/15/2014 1359   LABSPEC 1.008 06/15/2014 1359   PHURINE 7.0 06/15/2014 1359   GLUCOSEU Negative 06/15/2014 1359   HGBUR Negative 06/15/2014 1359   BILIRUBINUR Negative 06/15/2014 1359   KETONESUR Negative 06/15/2014 1359   PROTEINUR Negative 06/15/2014 1359   NITRITE Negative 06/15/2014 1359   LEUKOCYTESUR Negative 06/15/2014 1359   Sepsis  Labs: @LABRCNTIP (procalcitonin:4,lacticidven:4) )No results found for this or any previous visit (from the past 240 hour(s)).   Radiological Exams on Admission: Dg Chest 2 View  Result Date: 08/21/2018 CLINICAL DATA:  Chest pain for 4 days. EXAM: CHEST - 2 VIEW COMPARISON:  03/28/2018 FINDINGS: The cardiac silhouette, mediastinal and hilar contours are within normal limits and stable. Stable right-sided epicardial fat pad. The lungs are clear. No pleural effusion. The bony thorax is intact. IMPRESSION: No acute cardiopulmonary findings. Electronically Signed   By: Rudie Meyer M.D.   On: 08/21/2018 17:13    EKG: Independently reviewed.  Normal sinus rhythm.  Assessment/Plan Principal Problem:   Chest pain Active Problems:   Essential hypertension   Hyperlipidemia   Diabetes mellitus type 2 in obese (HCC)    1. Chest pain -given the multiple risk factors including diabetes.  Cycle  cardiac markers rule out ACS.  As per the cardiology notes in care everywhere patient had functional cardiac stress test in 2015 which was unremarkable.  Aspirin PRN nitroglycerin.  We will continue beta-blockers and statins.  Consult cardiology.  N.p.o. in a.m. anticipation of procedure. 2. Hypertension on amlodipine beta-blockers Vasotec. 3. Chronic kidney disease stage III creatinine appears to be at baseline. 4. Mild hypernatremia -we will gently hydrate with half-normal saline and follow metabolic panel. 5. Diabetes mellitus type 2 on diet.  Will keep patient on sliding scale coverage. 6. Hyperlipidemia on statins. 7. Sleep apnea on CPAP. 8. Morbid obesity is being followed up at [facility] weight management clinic.   DVT prophylaxis: Lovenox. Code Status: Full code. Family Communication: Discussed with patient. Disposition Plan: Home. Consults called: Cardiology. Admission status: Observation.   Eduard Clos MD [facility] Pager [phone].  If 7PM-7AM, please contact  night-coverage [email or web address] Password [facility]  08/21/2018, 10:15 PM

## 2018-08-21 NOTE — ED Notes (Signed)
Pt CBG 84

## 2018-08-22 ENCOUNTER — Observation Stay (HOSPITAL_BASED_OUTPATIENT_CLINIC_OR_DEPARTMENT_OTHER): Payer: Medicaid Other

## 2018-08-22 ENCOUNTER — Other Ambulatory Visit (HOSPITAL_COMMUNITY): Payer: Self-pay | Admitting: *Deleted

## 2018-08-22 ENCOUNTER — Encounter (HOSPITAL_COMMUNITY): Payer: Self-pay | Admitting: Physician Assistant

## 2018-08-22 DIAGNOSIS — E1169 Type 2 diabetes mellitus with other specified complication: Secondary | ICD-10-CM | POA: Diagnosis not present

## 2018-08-22 DIAGNOSIS — R079 Chest pain, unspecified: Secondary | ICD-10-CM | POA: Diagnosis not present

## 2018-08-22 DIAGNOSIS — E7801 Familial hypercholesterolemia: Secondary | ICD-10-CM | POA: Diagnosis not present

## 2018-08-22 DIAGNOSIS — I10 Essential (primary) hypertension: Secondary | ICD-10-CM | POA: Diagnosis not present

## 2018-08-22 LAB — NM MYOCAR SINGLE W/PLANAR W/WALL MOTION AND EF
CHL CUP RESTING HR STRESS: 65 {beats}/min
CSEPEDS: 0 s
Estimated workload: 1 METS
Exercise duration (min): 0 min
Peak HR: 85 {beats}/min

## 2018-08-22 LAB — TROPONIN I

## 2018-08-22 LAB — GLUCOSE, CAPILLARY
Glucose-Capillary: 82 mg/dL (ref 70–99)
Glucose-Capillary: 77 mg/dL (ref 70–99)

## 2018-08-22 LAB — CREATININE, SERUM
CREATININE: 1.44 mg/dL — AB (ref 0.44–1.00)
GFR, EST AFRICAN AMERICAN: 47 mL/min — AB (ref >60)
GFR, EST NON AFRICAN AMERICAN: 40 mL/min — AB (ref >60)

## 2018-08-22 LAB — HIV ANTIBODY (ROUTINE TESTING W REFLEX): HIV Screen 4th Generation wRfx: NONREACTIVE

## 2018-08-22 MED ORDER — REGADENOSON 0.4 MG/5ML IV SOLN
0.4000 mg | Freq: Once | INTRAVENOUS | Status: AC
  Administered 2018-08-22: 0.4 mg via INTRAVENOUS

## 2018-08-22 MED ORDER — TECHNETIUM TC 99M TETROFOSMIN IV KIT
30.0000 | PACK | Freq: Once | INTRAVENOUS | Status: AC | PRN
  Administered 2018-08-22: 30 via INTRAVENOUS

## 2018-08-22 MED ORDER — REGADENOSON 0.4 MG/5ML IV SOLN
INTRAVENOUS | Status: AC
  Filled 2018-08-22: qty 5

## 2018-08-22 MED ORDER — SODIUM CHLORIDE 0.45 % IV SOLN
INTRAVENOUS | Status: DC
  Administered 2018-08-22: 05:00:00 via INTRAVENOUS

## 2018-08-22 NOTE — Progress Notes (Signed)
CSW received consult regarding housing resources and that patient will soon lose her housing. Patient not in the room (assumed to be in the restroom). CSW provided resources to patient's nurse. Please contact CSW if patient has additional needs.   CSW signing off.  Osborne Cascoadia Mehul Rudin LCSW [phone]

## 2018-08-22 NOTE — Plan of Care (Signed)
Pt alert and oriented x4. Ambulatory with steady gait. Pt states chest pain has improved with pain medication. Denies nausea, or SOB. Pt able to verbalize risk factors for angina. Diagnostic tests and labs improving. Pt progressing towards discharge.

## 2018-08-22 NOTE — Progress Notes (Signed)
Nuclear stress test reviewed with Dr. Royann Shiversroitoru, low risk, no evidence of ischemia or prior MI. EF is normal. No further cardiac workup planned as inpatient. We recommend she f/u with Dr. Lady GaryFath in a few weeks, added info to AVS. Patient is not currently reporting anymore chest pain but has felt nauseated this afternoon. I am not certain if any other workup is pending (I.e GI, GB) so will defer further workup to IM. Spoke with nurse to touch base with primary team. Ronie Spiesayna Estha Few PA-C

## 2018-08-22 NOTE — Progress Notes (Signed)
CSW received consult regarding transportation issues. Patient reported that she has no one to come and pick her up and no money. When offered a bus pass, she reported that she was not comfortable riding the bus. CSW provided one-time taxi voucher.   CSW signing off.  Osborne Cascoadia Jacole Capley LCSW [phone]

## 2018-08-22 NOTE — Progress Notes (Signed)
Pt. Was wheeled down to main entrance to meet a taxi in 15 min.

## 2018-08-22 NOTE — Discharge Summary (Signed)
Physician Discharge Summary  Patient ID: Phyllis MatinDeborah Kimmel MRN: [id number] DOB/AGE: [birth year] 57 y.o.  Admit date: 08/21/2018 Discharge date: 08/22/2018  Admission Diagnoses:  Discharge Diagnoses:  Principal Problem:   Chest pain Active Problems:   Essential hypertension   Hyperlipidemia   Diabetes mellitus type 2 in obese [facility](HCC)   Discharged Condition: stable  Hospital Course: Patient is a 57 year old female with past medical history significant for morbid obesity, hypertension, asthma, hyperlipidemia, chronic kidney disease stage III, obstructive sleep apnea.  There is prior documentation of diabetes mellitus that is diet controlled, but patient denies history of diabetes mellitus.  Patient was admitted with atypical chest pain.  Iliac enzymes were cycled and they come back negative.  EKG was nonrevealing.  Cardiology team was consulted, and nuclear cardiac stress test was recommended.  Cardiac stress test has come back negative.  Patient will be discharged back home to the care of the primary care provider.  Patient will also follow with cardiology team on discharge.  Consults: cardiology  Significant Diagnostic Studies: nuclear cardiac stress test.  Discharge Exam: Blood pressure 104/61, pulse 65, temperature (!) 97.5 F (36.4 C), temperature source Oral, resp. rate 20, height 5\' 4"  (1.626 m), weight 125.6 kg, SpO2 99 %.   Disposition: Discharge disposition: 01-Home or Self Care       Discharge Instructions    Diet - low sodium heart healthy   Complete by:  As directed    Increase activity slowly   Complete by:  As directed      Allergies as of 08/22/2018   No Known Allergies     Medication List    TAKE these medications   allopurinol 300 MG tablet Commonly known as:  ZYLOPRIM Take 300 mg by mouth daily.   amLODipine 5 MG tablet Commonly known as:  NORVASC Take 5 mg by mouth daily.   aspirin EC 81 MG tablet Take 81 mg by mouth daily.   buPROPion 150 MG  24 hr tablet Commonly known as:  WELLBUTRIN XL Take 150 mg by mouth daily.   citalopram 40 MG tablet Commonly known as:  CELEXA Take 40 mg by mouth daily.   Colchicine 0.6 MG Caps Take 0.6 mg by mouth daily.   enalapril 10 MG tablet Commonly known as:  VASOTEC Take 10 mg by mouth daily.   famotidine 20 MG tablet Commonly known as:  PEPCID Take 20 mg by mouth daily.   metoprolol tartrate 50 MG tablet Commonly known as:  LOPRESSOR Take 50 mg by mouth 2 (two) times daily.   polyethylene glycol packet Commonly known as:  MIRALAX / GLYCOLAX Take 17 g by mouth daily as needed for moderate constipation.   PROAIR HFA 108 (90 Base) MCG/ACT inhaler Generic drug:  albuterol Inhale 2 puffs into the lungs every 6 (six) hours as needed for wheezing or shortness of breath.   simvastatin 20 MG tablet Commonly known as:  ZOCOR Take 20 mg by mouth daily.      Follow-up Information    Fath, Darlin PriestlyKenneth A, MD. Schedule an appointment as soon as possible for a visit in 2 week(s).   Specialty:  Cardiology Contact information: [address] [place] [id number] [phone]           Signed: Barnetta ChapelSylvester I Donyell Ding 08/22/2018, 3:20 PM

## 2018-08-22 NOTE — Consult Note (Addendum)
Cardiology Consultation:   Patient ID: Phyllis Miller; [id number]; 1961/01/20   Admit date: 08/21/2018 Date of Consult: 08/22/2018  Primary Care Provider: Earnestine Leys, MD Primary Cardiologist: Dr. Lady Gary Gavin [place])  Chief Complaint: chest pain  Patient Profile:   Phyllis Miller is a 57 y.o. female with a hx of severe obesity (followed in [facility] wt mgmt), HTN, HLD, depression, migraines, gout, sleep apnea (uses CPAP), CKD stage III, former tobacco abuse (24 yrs), palpitations who is being seen today for the evaluation of chest pain at the request of Dr. Toniann Fail.  History of Present Illness:   Phyllis Miller has previously seen Dr. Lady Gary for atypical chest pain felt stress/MSK induced. She is on metoprolol for h/o palpitations, not defined. (Telemetry here shows NSR with occ PACs.) Nuclear stress test in 2015 was normal, EF 58%. In CareEverywhere there is an exercise test listed 12/2017 but there is no result/report associated. She denies having a repeat study at that time and it is not referenced in Dr. America Brown recent note from 05/2018. She has HTN, HLD, and smoked for 24 years. Diet-controlled DM is listed in the H&P but she adamantly denies this. She does have a family hx of heart disease - thinks her mother had blockage in her late 32s, and brother had some sort of heart issue in early 32s. She has had intermittent chest pain since last Saturday 9/14. This initially started as a brief tightness but resolved spontaneously. On Sunday she went to church and when she got home felt severe chest pain with radiation down both her arms. It lasted 20 minutes and resolved spontaneously. From that day forward she began regularly having chest pain episodes coming and going lasting about 10 minutes at a time, not worse with any provocative factors and relieved spontaneously. On Tuesday she did take bayer ASA and felt it helped. There was mild associated SOB. Finally yesterday she "couldn't stand it anymore"  so came to the ER for evaluation. Labs reveal mild hypernatremia of 146, renal insufficiency with CR 1.4, albumin 3.3, troponins neg x 4, CBC wnl, CXR NAD, VSS- not tachycardic, tachypneic or hypoxic. EKG unchanged from prior. She received Tylenol for recurrent discomfort yesterday that relieved the pain. There was no associated arrhythmia on tele. She is pain free this AM. She has noticed mild ankle edema lately but otherwise no acute changes otherwise. She loves to walk regularly and has not had any angina with this. However, when CP arose last Saturday she stopped walking out of fear.  Past Medical History:  Diagnosis Date  . Asthma   . Bronchitis   . CKD (chronic kidney disease), stage III (HCC)   . Depression   . Heart palpitations   . Hypercholesteremia   . Hypertension   . Morbid obesity (HCC)   . Sleep apnea     Past Surgical History:  Procedure Laterality Date  . ABDOMINAL HYSTERECTOMY    . TONSILLECTOMY       Inpatient Medications: Scheduled Meds: . allopurinol  300 mg Oral Daily  . amLODipine  5 mg Oral Daily  . aspirin  324 mg Oral Once  . aspirin EC  81 mg Oral Daily  . buPROPion  150 mg Oral Daily  . citalopram  40 mg Oral Daily  . colchicine  0.6 mg Oral Daily  . enalapril  10 mg Oral Daily  . enoxaparin (LOVENOX) injection  40 mg Subcutaneous QHS  . famotidine  20 mg Oral Daily  . insulin aspart  0-9 Units Subcutaneous TID WC  . metoprolol tartrate  50 mg Oral BID  . simvastatin  20 mg Oral Daily   Continuous Infusions: . sodium chloride 75 mL/hr at 08/22/18 0627   PRN Meds: acetaminophen, albuterol, ondansetron (ZOFRAN) IV, polyethylene glycol  Home Meds: Prior to Admission medications   Medication Sig Start Date End Date Taking? Authorizing Provider  albuterol (PROAIR HFA) 108 (90 Base) MCG/ACT inhaler Inhale 2 puffs into the lungs every 6 (six) hours as needed for wheezing or shortness of breath.  02/29/16 07/04/19 Yes [provider]    allopurinol (ZYLOPRIM) 300 MG tablet Take 300 mg by mouth daily.  07/29/18 07/29/19 Yes [provider]  amLODipine (NORVASC) 5 MG tablet Take 5 mg by mouth daily.  05/03/16 07/29/19 Yes [provider]  aspirin EC 81 MG tablet Take 81 mg by mouth daily.    Yes [provider]  buPROPion (WELLBUTRIN XL) 150 MG 24 hr tablet Take 150 mg by mouth daily.    Yes [provider]  citalopram (CELEXA) 40 MG tablet Take 40 mg by mouth daily.  08/26/10  Yes [provider]  Colchicine 0.6 MG CAPS Take 0.6 mg by mouth daily.  02/05/18  Yes [provider]  enalapril (VASOTEC) 10 MG tablet Take 10 mg by mouth daily.  04/13/16  Yes [provider]  famotidine (PEPCID) 20 MG tablet Take 20 mg by mouth daily.  08/20/12  Yes [provider]  metoprolol tartrate (LOPRESSOR) 50 MG tablet Take 50 mg by mouth 2 (two) times daily.  05/31/18 05/31/19 Yes [provider]  polyethylene glycol (MIRALAX / GLYCOLAX) packet Take 17 g by mouth daily as needed for moderate constipation.  08/30/17  Yes [provider]  simvastatin (ZOCOR) 20 MG tablet Take 20 mg by mouth daily.  01/04/16 07/29/19 Yes [provider]    Allergies:   No Known Allergies  Social History:   Social History   Socioeconomic History  . Marital status: Single    Spouse name: Not on file  . Number of children: Not on file  . Years of education: Not on file  . Highest education level: Not on file  Occupational History  . Not on file  Social Needs  . Financial resource strain: Not on file  . Food insecurity:    Worry: Not on file    Inability: Not on file  . Transportation needs:    Medical: Not on file    Non-medical: Not on file  Tobacco Use  . Smoking status: Former Smoker    Types: Cigarettes    Last attempt to quit: 07/19/2013    Years since quitting: 5.0  . Smokeless tobacco: Never Used  Substance and Sexual Activity  . Alcohol use: No  . Drug  use: Never  . Sexual activity: Not on file  Lifestyle  . Physical activity:    Days per week: Not on file    Minutes per session: Not on file  . Stress: Not on file  Relationships  . Social connections:    Talks on phone: Not on file    Gets together: Not on file    Attends religious service: Not on file    Active member of club or organization: Not on file    Attends meetings of clubs or organizations: Not on file    Relationship status: Not on file  . Intimate partner violence:    Fear of current or ex partner: Not on  file    Emotionally abused: Not on file    Physically abused: Not on file    Forced sexual activity: Not on file  Other Topics Concern  . Not on file  Social History Narrative  . Not on file    Family History:   The patient's family history includes CAD in her brother.  ROS:  Please see the history of present illness.  All other ROS reviewed and negative.     Physical Exam/Data:   Vitals:   08/21/18 2233 08/21/18 2342 08/22/18 0119 08/22/18 0453  BP:   (!) 107/45 120/68  Pulse:  80 75 73  Resp:  16 16 17   Temp:   97.9 F (36.6 C) 97.9 F (36.6 C)  TempSrc:   Oral Axillary  SpO2:  97% 98% 98%  Weight: 125.6 kg     Height: 5\' 4"  (1.626 m)       Intake/Output Summary (Last 24 hours) at 08/22/2018 0745 Last data filed at 08/22/2018 0627 Gross per 24 hour  Intake 248.79 ml  Output -  Net 248.79 ml   Filed Weights   08/21/18 2233  Weight: 125.6 kg   Body mass index is 47.55 kg/m.  General: Morbidly obese AAF in no acute distress. Lying flat in bed without dyspnea Head: Normocephalic, atraumatic, sclera non-icteric, no xanthomas, nares are without discharge.  Neck: Negative for carotid bruits. JVD not elevated. Lungs: Clear bilaterally to auscultation without wheezes, rales, or rhonchi. Breathing is unlabored. Heart: RRR with S1 S2, distant given habitus. No murmurs, rubs, or gallops appreciated. Abdomen: Soft, non-tender, non-distended with  normoactive bowel sounds. No hepatomegaly. No rebound/guarding. No obvious abdominal masses. Msk:  Strength and tone appear normal for age. Extremities: No clubbing or cyanosis. No edema.  Distal pedal pulses are 2+ and equal bilaterally. Neuro: Alert and oriented X 3. No facial asymmetry. No focal deficit. Moves all extremities spontaneously. Psych:  Responds to questions appropriately with a normal affect.  EKG:  The EKG was personally reviewed and demonstrates NSR 77bpm nonspecific STT changes similar to prior  Relevant CV Studies: As above  Laboratory Data:  Chemistry Recent Labs  Lab 08/21/18 1705 08/21/18 2317  NA 146*  --   K 3.8  --   CL 115*  --   CO2 20*  --   GLUCOSE 80  --   BUN 13  --   CREATININE 1.40* 1.44*  CALCIUM 9.1  --   GFRNONAA 42* 40*  GFRAA 48* 47*  ANIONGAP 11  --     Recent Labs  Lab 08/21/18 1705  PROT 6.6  ALBUMIN 3.3*  AST 15  ALT 17  ALKPHOS 48  BILITOT 0.3   Hematology Recent Labs  Lab 08/21/18 1705 08/21/18 2317  WBC 7.4 7.0  RBC 4.45 4.32  HGB 12.6 12.2  HCT 41.4 38.6  MCV 93.0 89.4  MCH 28.3 28.2  MCHC 30.4 31.6  RDW 15.0 14.8  PLT 202 212   Cardiac Enzymes Recent Labs  Lab 08/21/18 1705 08/21/18 2317 08/22/18 0355  TROPONINI <0.03 <0.03 <0.03    Recent Labs  Lab 08/21/18 1710  TROPIPOC 0.00    BNPNo results for input(s): BNP, PROBNP in the last 168 hours.  DDimer No results for input(s): DDIMER in the last 168 hours.  Radiology/Studies:  Dg Chest 2 View  Result Date: 08/21/2018 CLINICAL DATA:  Chest pain for 4 days. EXAM: CHEST - 2 VIEW COMPARISON:  03/28/2018 FINDINGS: The cardiac silhouette, mediastinal and hilar contours  are within normal limits and stable. Stable right-sided epicardial fat pad. The lungs are clear. No pleural effusion. The bony thorax is intact. IMPRESSION: No acute cardiopulmonary findings. Electronically Signed   By: Rudie MeyerP.  Gallerani M.D.   On: 08/21/2018 17:13    Assessment and Plan:     1. Chest pain with mixed typical/atypical features - despite fairly frequent CP over the course of 5 days, troponin is negative and EKG is relatively unchanged from previous. She is not tachycardic, tachypneic or hypoxic. I will review further workup with MD. Body habitus makes noninvasive imaging a bit challenging.  2. Essential HTN, controlled.  3. Hyperlipidemia - continue statin.  4. Morbid obesity - long term weight loss encouraged.  For questions or updates, please contact [facility] Please consult [email or web address] for contact info under Cardiology/STEMI.    Signed, Laurann Montanaayna N Dunn, PA-C  08/22/2018 7:45 AM  I have seen and examined the patient along with Laurann Montanaayna N Dunn, PA-C .  I have reviewed the chart, notes and new data.  I agree with PA/NP's note.  Key new complaints: Atypical chest discomfort that is not exercise related, but has been severe and recurrent.  She describes it as a central gripping sensation.  Independently, also has tenderness to palpation over the left costochondral joints. Key examination changes: Physical exam is limited by morbid obesity, but no obvious cardiovascular abnormalities. Key new findings / data: low risk ECG and cardiac enzymes.  Creatinine 1.4.  PLAN: She does have numerous coronary risk factors and is not a good candidate for contrast based procedures.  Recommend Lexiscan Myoview.  We will start with stress pictures.  If these are normal she will not need the resting picture.  Otherwise she will have to have a 2-day study, due to her weight.  Thurmon FairMihai Francesca Strome, MD, [facility]ACC [facility] [phone] 08/22/2018, 8:59 AM

## 2018-08-22 NOTE — Progress Notes (Signed)
This RN paged attending MD and Cardiology in regards to pt discharge.  Cards is waiting on final result from Nuclear Med (per Protocol) before they are able to approve pt for d/c.

## 2018-08-23 ENCOUNTER — Observation Stay (HOSPITAL_COMMUNITY)
Admit: 2018-08-23 | Discharge: 2018-08-23 | Disposition: A | Discharge status: Home / Self Care | Payer: Medicaid Other | Attending: Physician Assistant | Admitting: Physician Assistant

## 2019-05-17 ENCOUNTER — Other Ambulatory Visit: Payer: Self-pay | Admitting: *Deleted

## 2019-05-17 DIAGNOSIS — Z20822 Contact with and (suspected) exposure to covid-19: Secondary | ICD-10-CM

## 2019-05-19 NOTE — Addendum Note (Signed)
Addended by: Brigitte Pulse on: 05/19/2019 09:23 AM   Modules accepted: Orders

## 2020-03-23 ENCOUNTER — Encounter (HOSPITAL_COMMUNITY): Payer: Self-pay | Admitting: Emergency Medicine

## 2020-03-23 ENCOUNTER — Emergency Department (HOSPITAL_COMMUNITY)
Admission: EM | Admit: 2020-03-23 | Discharge: 2020-03-23 | Disposition: A | Discharge status: Home / Self Care | Payer: Medicaid Other | Attending: Emergency Medicine | Admitting: Emergency Medicine

## 2020-03-23 ENCOUNTER — Emergency Department (HOSPITAL_COMMUNITY): Payer: Medicaid Other

## 2020-03-23 ENCOUNTER — Other Ambulatory Visit: Payer: Self-pay

## 2020-03-23 DIAGNOSIS — R0602 Shortness of breath: Secondary | ICD-10-CM | POA: Insufficient documentation

## 2020-03-23 DIAGNOSIS — R0789 Other chest pain: Secondary | ICD-10-CM | POA: Insufficient documentation

## 2020-03-23 DIAGNOSIS — Z5321 Procedure and treatment not carried out due to patient leaving prior to being seen by health care provider: Secondary | ICD-10-CM | POA: Insufficient documentation

## 2020-03-23 LAB — TROPONIN I (HIGH SENSITIVITY)
Troponin I (High Sensitivity): <2 ng/L (ref <18)
Troponin I (High Sensitivity): 3 ng/L (ref <18)

## 2020-03-23 LAB — BASIC METABOLIC PANEL
Anion gap: 8 (ref 5–15)
BUN: 35 mg/dL — ABNORMAL HIGH (ref 6–20)
CO2: 22 mmol/L (ref 22–32)
Calcium: 9.3 mg/dL (ref 8.9–10.3)
Chloride: 114 mmol/L — ABNORMAL HIGH (ref 98–111)
Creatinine, Ser: 1.81 mg/dL — ABNORMAL HIGH (ref 0.44–1.00)
GFR calc Af Amer: 36 mL/min — ABNORMAL LOW (ref >60)
GFR calc non Af Amer: 31 mL/min — ABNORMAL LOW (ref >60)
Glucose, Bld: 104 mg/dL — ABNORMAL HIGH (ref 70–99)
Potassium: 4.4 mmol/L (ref 3.5–5.1)
Sodium: 144 mmol/L (ref 135–145)

## 2020-03-23 LAB — CBC
HCT: 42 % (ref 36.0–46.0)
Hemoglobin: 13.1 g/dL (ref 12.0–15.0)
MCH: 27.6 pg (ref 26.0–34.0)
MCHC: 31.2 g/dL (ref 30.0–36.0)
MCV: 88.6 fL (ref 80.0–100.0)
Platelets: 263 10*3/uL (ref 150–400)
RBC: 4.74 MIL/uL (ref 3.87–5.11)
RDW: 14.8 % (ref 11.5–15.5)
WBC: 8.2 10*3/uL (ref 4.0–10.5)
nRBC: 0 % (ref 0.0–0.2)

## 2020-03-23 LAB — I-STAT BETA HCG BLOOD, ED (MC, WL, AP ONLY): I-stat hCG, quantitative: <5 m[IU]/mL (ref <5)

## 2020-03-23 NOTE — ED Triage Notes (Signed)
Pt arrives via EMS from home with CP and SOB starting today. EMS gave 1 nitro and 324 ASA with no relief. Family hx of MI.

## 2020-03-23 NOTE — ED Notes (Signed)
IV removed. Pt states she could no longer wait and wanted to go home and eat.

## 2020-03-23 NOTE — ED Notes (Signed)
Pt called for vitals, no response x1 

## 2020-03-28 ENCOUNTER — Encounter (HOSPITAL_COMMUNITY): Payer: Self-pay

## 2020-03-28 ENCOUNTER — Other Ambulatory Visit: Payer: Self-pay

## 2020-03-28 ENCOUNTER — Emergency Department (HOSPITAL_COMMUNITY): Payer: Medicaid Other

## 2020-03-28 ENCOUNTER — Emergency Department (HOSPITAL_COMMUNITY)
Admission: EM | Admit: 2020-03-28 | Discharge: 2020-03-28 | Disposition: A | Discharge status: Home / Self Care | Payer: Medicaid Other | Attending: Emergency Medicine | Admitting: Emergency Medicine

## 2020-03-28 DIAGNOSIS — R10816 Epigastric abdominal tenderness: Secondary | ICD-10-CM | POA: Insufficient documentation

## 2020-03-28 DIAGNOSIS — R0789 Other chest pain: Secondary | ICD-10-CM | POA: Diagnosis present

## 2020-03-28 DIAGNOSIS — Z7982 Long term (current) use of aspirin: Secondary | ICD-10-CM | POA: Diagnosis not present

## 2020-03-28 DIAGNOSIS — E1122 Type 2 diabetes mellitus with diabetic chronic kidney disease: Secondary | ICD-10-CM | POA: Diagnosis not present

## 2020-03-28 DIAGNOSIS — I129 Hypertensive chronic kidney disease with stage 1 through stage 4 chronic kidney disease, or unspecified chronic kidney disease: Secondary | ICD-10-CM | POA: Insufficient documentation

## 2020-03-28 DIAGNOSIS — N183 Chronic kidney disease, stage 3 unspecified: Secondary | ICD-10-CM | POA: Insufficient documentation

## 2020-03-28 DIAGNOSIS — Z79899 Other long term (current) drug therapy: Secondary | ICD-10-CM | POA: Insufficient documentation

## 2020-03-28 LAB — TROPONIN I (HIGH SENSITIVITY)
Troponin I (High Sensitivity): 2 ng/L (ref <18)
Troponin I (High Sensitivity): 3 ng/L (ref <18)

## 2020-03-28 LAB — BASIC METABOLIC PANEL
Anion gap: 6 (ref 5–15)
BUN: 25 mg/dL — ABNORMAL HIGH (ref 6–20)
CO2: 25 mmol/L (ref 22–32)
Calcium: 9.4 mg/dL (ref 8.9–10.3)
Chloride: 113 mmol/L — ABNORMAL HIGH (ref 98–111)
Creatinine, Ser: 1.67 mg/dL — ABNORMAL HIGH (ref 0.44–1.00)
GFR calc Af Amer: 39 mL/min — ABNORMAL LOW (ref >60)
GFR calc non Af Amer: 34 mL/min — ABNORMAL LOW (ref >60)
Glucose, Bld: 126 mg/dL — ABNORMAL HIGH (ref 70–99)
Potassium: 4.2 mmol/L (ref 3.5–5.1)
Sodium: 144 mmol/L (ref 135–145)

## 2020-03-28 LAB — CBC
HCT: 43.1 % (ref 36.0–46.0)
Hemoglobin: 13.3 g/dL (ref 12.0–15.0)
MCH: 27.4 pg (ref 26.0–34.0)
MCHC: 30.9 g/dL (ref 30.0–36.0)
MCV: 88.7 fL (ref 80.0–100.0)
Platelets: 267 10*3/uL (ref 150–400)
RBC: 4.86 MIL/uL (ref 3.87–5.11)
RDW: 14.9 % (ref 11.5–15.5)
WBC: 6.6 10*3/uL (ref 4.0–10.5)
nRBC: 0 % (ref 0.0–0.2)

## 2020-03-28 MED ORDER — SODIUM CHLORIDE 0.9% FLUSH
3.0000 mL | Freq: Once | INTRAVENOUS | Status: AC
  Administered 2020-03-28: 3 mL via INTRAVENOUS

## 2020-03-28 MED ORDER — OMEPRAZOLE 20 MG PO CPDR
20.0000 mg | DELAYED_RELEASE_CAPSULE | Freq: Two times a day (BID) | ORAL | 0 refills | Status: DC
Start: 2020-03-28 — End: 2020-03-28

## 2020-03-28 MED ORDER — OMEPRAZOLE 20 MG PO CPDR: 20.0000 mg | DELAYED_RELEASE_CAPSULE | Freq: Two times a day (BID) | ORAL | 0 refills | Status: AC

## 2020-03-28 MED ORDER — ALUM & MAG HYDROXIDE-SIMETH 200-200-20 MG/5ML PO SUSP
30.0000 mL | Freq: Once | ORAL | Status: AC
  Administered 2020-03-28: 20:00:00 30 mL via ORAL
  Filled 2020-03-28: qty 30

## 2020-03-28 MED ORDER — LIDOCAINE VISCOUS HCL 2 % MT SOLN
15.0000 mL | Freq: Once | OROMUCOSAL | Status: AC
  Administered 2020-03-28: 15 mL via ORAL
  Filled 2020-03-28: qty 15

## 2020-03-28 NOTE — ED Notes (Signed)
Patient verbalizes understanding of discharge instructions. Opportunity for questioning and answers were provided. Armband removed by staff, pt discharged from ED ambulatory to home.  

## 2020-03-28 NOTE — ED Triage Notes (Signed)
Patient complains of intermittent of intermittent chest pressure for several days.patient states that the burning and pain worse after eating. Cardiology has told her not her heart. Not taking any reflux meds. NAD

## 2020-03-28 NOTE — ED Provider Notes (Signed)
[facility] EMERGENCY DEPARTMENT Provider Note   CSN: [id number] Arrival date & time: 03/28/20  1438     History Chief Complaint  Patient presents with  . Chest Pain    Phyllis Miller is a 59 y.o. female.  The history is provided by the patient and medical records. No language interpreter was used.  Chest Pain    59 year old obese female with history of hypertension, CKD, hypercholesterolemia, diabetes presenting complaint of chest pain.  Patient report for the past week she has had recurrent pain in her chest.  She described pain as a discomforting pressure achy sensation to her midsternal region, brought on by eating especially greasy food, with some associated shortness of breath when the pain is intense.  Pain usually comes and goes and each episode lasting for about 5 minutes.  No associated nausea or vomiting no fever chills no productive cough no dysuria no diarrhea constipation.  No specific treatment tried except she did report some improvement whenever she drinks ginger ale.  She still has an intact gallbladder.  She does have family history of cardiac disease.  Patient last had a nuclear cardiac stress test in September 2019 that came back negative.  Past Medical History:  Diagnosis Date  . Asthma   . Bronchitis   . CKD (chronic kidney disease), stage III   . Depression   . Heart palpitations   . Hypercholesteremia   . Hypertension   . Morbid obesity ([place])   . Sleep apnea     Patient Active Problem List   Diagnosis Date Noted  . Chest pain 08/21/2018  . Essential hypertension 08/21/2018  . Hyperlipidemia 08/21/2018  . Diabetes mellitus type 2 in obese ([place]) 08/21/2018    Past Surgical History:  Procedure Laterality Date  . ABDOMINAL HYSTERECTOMY    . TONSILLECTOMY       OB History   No obstetric history on file.     Family History  Problem Relation Age of Onset  . CAD Brother     Social History   Tobacco Use  . Smoking  status: Former Smoker    Types: Cigarettes    Quit date: 07/19/2013    Years since quitting: 6.6  . Smokeless tobacco: Never Used  Substance Use Topics  . Alcohol use: No  . Drug use: Never    Home Medications Prior to Admission medications   Medication Sig Start Date End Date Taking? Authorizing Provider  albuterol (PROAIR HFA) 108 (90 Base) MCG/ACT inhaler Inhale 2 puffs into the lungs every 6 (six) hours as needed for wheezing or shortness of breath.  02/29/16 07/04/19  [provider]  allopurinol (ZYLOPRIM) 300 MG tablet Take 300 mg by mouth daily.  07/29/18 07/29/19  [provider]  amLODipine (NORVASC) 5 MG tablet Take 5 mg by mouth daily.  05/03/16 07/29/19  [provider]  aspirin EC 81 MG tablet Take 81 mg by mouth daily.     [provider]  buPROPion (WELLBUTRIN XL) 150 MG 24 hr tablet Take 150 mg by mouth daily.     [provider]  citalopram (CELEXA) 40 MG tablet Take 40 mg by mouth daily.  08/26/10   [provider]  Colchicine 0.6 MG CAPS Take 0.6 mg by mouth daily.  02/05/18   [provider]  enalapril (VASOTEC) 10 MG tablet Take 10 mg by mouth daily.  04/13/16   [provider]  famotidine (PEPCID) 20 MG tablet Take 20 mg by mouth  daily.  08/20/12   [provider]  metoprolol tartrate (LOPRESSOR) 50 MG tablet Take 50 mg by mouth 2 (two) times daily.  05/31/18 05/31/19  [provider]  polyethylene glycol (MIRALAX / GLYCOLAX) packet Take 17 g by mouth daily as needed for moderate constipation.  08/30/17   [provider]  simvastatin (ZOCOR) 20 MG tablet Take 20 mg by mouth daily.  01/04/16 07/29/19  [provider]    Allergies    Patient has no known allergies.  Review of Systems   Review of Systems  Cardiovascular: Positive for chest pain.  All other systems reviewed and are negative.   Physical Exam Updated Vital Signs BP 107/64 (BP Location: Left Wrist)   Pulse  69   Temp 98.3 F (36.8 C) (Oral)   Resp 16   SpO2 99%   Physical Exam Vitals and nursing note reviewed.  Constitutional:      General: She is not in acute distress.    Appearance: She is well-developed. She is obese.     Comments: Morbidly obese.  Sitting in the chair appears to be in no acute discomfort.  HENT:     Head: Atraumatic.  Eyes:     Conjunctiva/sclera: Conjunctivae normal.  Cardiovascular:     Rate and Rhythm: Normal rate and regular rhythm.     Pulses: Normal pulses.     Heart sounds: Normal heart sounds.  Pulmonary:     Effort: Pulmonary effort is normal.     Breath sounds: Normal breath sounds.  Abdominal:     Palpations: Abdomen is soft.     Tenderness: There is abdominal tenderness (Mild epigastric tenderness without guarding or rebound tenderness.  Negative Murphy sign, no pain at McBurney's point.).  Musculoskeletal:     Cervical back: Neck supple.  Skin:    Findings: No rash.  Neurological:     Mental Status: She is alert and oriented to person, place, and time.  Psychiatric:        Mood and Affect: Mood normal.     ED Results / Procedures / Treatments   Labs (all labs ordered are listed, but only abnormal results are displayed) Labs Reviewed  BASIC METABOLIC PANEL - Abnormal; Notable for the following components:      Result Value   Chloride 113 (*)    Glucose, Bld 126 (*)    BUN 25 (*)    Creatinine, Ser 1.67 (*)    GFR calc non Af Amer 34 (*)    GFR calc Af Amer 39 (*)    All other components within normal limits  CBC  TROPONIN I (HIGH SENSITIVITY)  TROPONIN I (HIGH SENSITIVITY)    EKG EKG Interpretation  Date/Time:  Sunday March 28 2020 14:37:37 EDT Ventricular Rate:  70 PR Interval:  148 QRS Duration: 80 QT Interval:  388 QTC Calculation: 419 R Axis:   35 Text Interpretation: Normal sinus rhythm Normal ECG No significant change since last tracing Confirmed by Melene Plan [phone]) on 03/28/2020 6:31:23 PM   Radiology DG Chest  2 View  Result Date: 03/28/2020 CLINICAL DATA:  Intermittent chest pressure for several days EXAM: CHEST - 2 VIEW COMPARISON:  03/23/2020 FINDINGS: Frontal and lateral views of the chest demonstrate a stable cardiac silhouette. Prominent epicardial fat pad along the right heart border. Chronic interstitial prominence consistent with scarring. No airspace disease, effusion, or pneumothorax. IMPRESSION: 1. No acute intrathoracic process. Electronically Signed   By: Sharlet Salina M.D.   On: 03/28/2020  15:28   US Abdomen Limited  Result Date: 03/28/2020 CLINICAL DATA:  59 year old female with RIGHT UPPER quadrant abdominal pain. EXAM: ULTRASOUND ABDOMEN LIMITED RIGHT UPPER QUADRANT COMPARISON:  None. FINDINGS: This study is slightly limited due to body habitus, patient positioning and bowel gas. Gallbladder: The gallbladder is difficult to visualize but no evidence of cholelithiasis or acute cholecystitis Common bile duct: Diameter: 2.7 mm.  Not well visualized but no definite abnormality. Liver: No definite abnormality. Portal vein is patent on color Doppler imaging with normal direction of blood flow towards the liver. Other: None. IMPRESSION: No abnormalities identified but limited evaluation due to body habitus, patient positioning and bowel gas. Electronically Signed   By: Harmon Pier M.D.   On: 03/28/2020 20:01    Procedures Procedures (including critical care time)  Medications Ordered in ED Medications  sodium chloride flush (NS) 0.9 % injection 3 mL (3 mLs Intravenous Given 03/28/20 1832)  alum & mag hydroxide-simeth (MAALOX/MYLANTA) 200-200-20 MG/5ML suspension 30 mL (30 mLs Oral Given 03/28/20 2016)    And  lidocaine (XYLOCAINE) 2 % viscous mouth solution 15 mL (15 mLs Oral Given 03/28/20 2016)    ED Course  I have reviewed the triage vital signs and the nursing notes.  Pertinent labs & imaging results that were available during my care of the patient were reviewed by me and considered in  my medical decision making (see chart for details).    MDM Rules/Calculators/A&P                      BP 136/72   Pulse 69   Temp 98.3 F (36.8 C) (Oral)   Resp (!) 21   SpO2 99%   Final Clinical Impression(s) / ED Diagnoses Final diagnoses:  Atypical chest pain    Rx / DC Orders ED Discharge Orders         Ordered    omeprazole (PRILOSEC) 20 MG capsule  2 times daily before meals     03/28/20 2157         6:18 PM Patient here with chest pain that has been intermittent for the past few days.  Pain is atypical of ACS.  She does report pain brought on by eating which may suggest GERD/potential gallbladder Disease.  Will obtain limited abdominal ultrasound.  Low suspicion for PE as patient does not have any significant risk factor, is not hypoxic and her pain is brought on by eating.   9:50 PM After receiving GI cocktail, patient reported pain felt much better. HEART score of 3, low risk of MACE.  abd [place] without evidence to suggest gallbladder etiology.  Suspect GERD.  Will d/c home with PPI and outpt f/u.  Return precaution discussed.    Fayrene Helper, PA-C 03/28/20 2158    Melene Plan, DO 03/28/20 2239

## 2020-07-06 ENCOUNTER — Inpatient Hospital Stay (HOSPITAL_COMMUNITY)
Admission: EM | Admit: 2020-07-06 | Discharge: 2020-07-11 | DRG: 178 | Disposition: A | Discharge status: Home / Self Care | Payer: Medicaid Other | Attending: Internal Medicine | Admitting: Internal Medicine

## 2020-07-06 ENCOUNTER — Encounter (HOSPITAL_COMMUNITY): Payer: Self-pay | Admitting: *Deleted

## 2020-07-06 ENCOUNTER — Emergency Department (HOSPITAL_COMMUNITY): Payer: Medicaid Other

## 2020-07-06 DIAGNOSIS — N183 Chronic kidney disease, stage 3 unspecified: Secondary | ICD-10-CM | POA: Diagnosis present

## 2020-07-06 DIAGNOSIS — U071 COVID-19: Secondary | ICD-10-CM | POA: Diagnosis not present

## 2020-07-06 DIAGNOSIS — K219 Gastro-esophageal reflux disease without esophagitis: Secondary | ICD-10-CM | POA: Diagnosis present

## 2020-07-06 DIAGNOSIS — Z7982 Long term (current) use of aspirin: Secondary | ICD-10-CM

## 2020-07-06 DIAGNOSIS — F419 Anxiety disorder, unspecified: Secondary | ICD-10-CM | POA: Diagnosis present

## 2020-07-06 DIAGNOSIS — E1169 Type 2 diabetes mellitus with other specified complication: Secondary | ICD-10-CM | POA: Diagnosis not present

## 2020-07-06 DIAGNOSIS — G4733 Obstructive sleep apnea (adult) (pediatric): Secondary | ICD-10-CM | POA: Diagnosis present

## 2020-07-06 DIAGNOSIS — I10 Essential (primary) hypertension: Secondary | ICD-10-CM | POA: Diagnosis not present

## 2020-07-06 DIAGNOSIS — Z79899 Other long term (current) drug therapy: Secondary | ICD-10-CM

## 2020-07-06 DIAGNOSIS — Z87891 Personal history of nicotine dependence: Secondary | ICD-10-CM

## 2020-07-06 DIAGNOSIS — R519 Headache, unspecified: Secondary | ICD-10-CM | POA: Diagnosis present

## 2020-07-06 DIAGNOSIS — I129 Hypertensive chronic kidney disease with stage 1 through stage 4 chronic kidney disease, or unspecified chronic kidney disease: Secondary | ICD-10-CM | POA: Diagnosis present

## 2020-07-06 DIAGNOSIS — N1832 Chronic kidney disease, stage 3b: Secondary | ICD-10-CM | POA: Diagnosis present

## 2020-07-06 DIAGNOSIS — J45909 Unspecified asthma, uncomplicated: Secondary | ICD-10-CM | POA: Diagnosis present

## 2020-07-06 DIAGNOSIS — E662 Morbid (severe) obesity with alveolar hypoventilation: Secondary | ICD-10-CM | POA: Diagnosis present

## 2020-07-06 DIAGNOSIS — E669 Obesity, unspecified: Secondary | ICD-10-CM

## 2020-07-06 DIAGNOSIS — E1122 Type 2 diabetes mellitus with diabetic chronic kidney disease: Secondary | ICD-10-CM | POA: Diagnosis present

## 2020-07-06 DIAGNOSIS — E875 Hyperkalemia: Secondary | ICD-10-CM | POA: Diagnosis present

## 2020-07-06 DIAGNOSIS — N179 Acute kidney failure, unspecified: Secondary | ICD-10-CM | POA: Diagnosis present

## 2020-07-06 DIAGNOSIS — F329 Major depressive disorder, single episode, unspecified: Secondary | ICD-10-CM | POA: Diagnosis present

## 2020-07-06 DIAGNOSIS — M109 Gout, unspecified: Secondary | ICD-10-CM | POA: Diagnosis present

## 2020-07-06 DIAGNOSIS — E785 Hyperlipidemia, unspecified: Secondary | ICD-10-CM | POA: Diagnosis present

## 2020-07-06 DIAGNOSIS — E119 Type 2 diabetes mellitus without complications: Secondary | ICD-10-CM | POA: Diagnosis present

## 2020-07-06 DIAGNOSIS — Z8249 Family history of ischemic heart disease and other diseases of the circulatory system: Secondary | ICD-10-CM

## 2020-07-06 DIAGNOSIS — E7801 Familial hypercholesterolemia: Secondary | ICD-10-CM

## 2020-07-06 LAB — COMPREHENSIVE METABOLIC PANEL
ALT: 35 U/L (ref 0–44)
AST: 40 U/L (ref 15–41)
Albumin: 2.5 g/dL — ABNORMAL LOW (ref 3.5–5.0)
Alkaline Phosphatase: 68 U/L (ref 38–126)
Anion gap: 12 (ref 5–15)
BUN: 68 mg/dL — ABNORMAL HIGH (ref 6–20)
CO2: 20 mmol/L — ABNORMAL LOW (ref 22–32)
Calcium: 8.8 mg/dL — ABNORMAL LOW (ref 8.9–10.3)
Chloride: 109 mmol/L (ref 98–111)
Creatinine, Ser: 3.74 mg/dL — ABNORMAL HIGH (ref 0.44–1.00)
GFR calc Af Amer: 15 mL/min — ABNORMAL LOW (ref >60)
GFR calc non Af Amer: 13 mL/min — ABNORMAL LOW (ref >60)
Glucose, Bld: 103 mg/dL — ABNORMAL HIGH (ref 70–99)
Potassium: 4.5 mmol/L (ref 3.5–5.1)
Sodium: 141 mmol/L (ref 135–145)
Total Bilirubin: 0.6 mg/dL (ref 0.3–1.2)
Total Protein: 7 g/dL (ref 6.5–8.1)

## 2020-07-06 LAB — URINALYSIS, ROUTINE W REFLEX MICROSCOPIC
Bilirubin Urine: NEGATIVE
Glucose, UA: NEGATIVE mg/dL
Ketones, ur: NEGATIVE mg/dL
Leukocytes,Ua: NEGATIVE
Nitrite: NEGATIVE
Protein, ur: >=300 mg/dL — AB
Specific Gravity, Urine: 1.01 (ref 1.005–1.030)
pH: 5 (ref 5.0–8.0)

## 2020-07-06 LAB — LACTIC ACID, PLASMA: Lactic Acid, Venous: 0.8 mmol/L (ref 0.5–1.9)

## 2020-07-06 LAB — CBC WITH DIFFERENTIAL/PLATELET
Abs Immature Granulocytes: 0.09 10*3/uL — ABNORMAL HIGH (ref 0.00–0.07)
Basophils Absolute: 0 10*3/uL (ref 0.0–0.1)
Basophils Relative: 0 %
Eosinophils Absolute: 0.2 10*3/uL (ref 0.0–0.5)
Eosinophils Relative: 3 %
HCT: 44.8 % (ref 36.0–46.0)
Hemoglobin: 14 g/dL (ref 12.0–15.0)
Immature Granulocytes: 1 %
Lymphocytes Relative: 27 %
Lymphs Abs: 1.8 10*3/uL (ref 0.7–4.0)
MCH: 26.9 pg (ref 26.0–34.0)
MCHC: 31.3 g/dL (ref 30.0–36.0)
MCV: 86.2 fL (ref 80.0–100.0)
Monocytes Absolute: 0.8 10*3/uL (ref 0.1–1.0)
Monocytes Relative: 12 %
Neutro Abs: 3.6 10*3/uL (ref 1.7–7.7)
Neutrophils Relative %: 57 %
Platelets: 348 10*3/uL (ref 150–400)
RBC: 5.2 MIL/uL — ABNORMAL HIGH (ref 3.87–5.11)
RDW: 14.7 % (ref 11.5–15.5)
WBC: 6.5 10*3/uL (ref 4.0–10.5)
nRBC: 0 % (ref 0.0–0.2)

## 2020-07-06 LAB — LIPASE, BLOOD: Lipase: 30 U/L (ref 11–51)

## 2020-07-06 LAB — CBG MONITORING, ED: Glucose-Capillary: 108 mg/dL — ABNORMAL HIGH (ref 70–99)

## 2020-07-06 LAB — SARS CORONAVIRUS 2 BY RT PCR (HOSPITAL ORDER, PERFORMED IN ~~LOC~~ HOSPITAL LAB): SARS Coronavirus 2: POSITIVE — AB

## 2020-07-06 MED ORDER — ONDANSETRON HCL 4 MG/2ML IJ SOLN: 4.0000 mg | Freq: Four times a day (QID) | INTRAMUSCULAR | Status: DC | PRN

## 2020-07-06 MED ORDER — COLCHICINE 0.6 MG PO TABS
0.6000 mg | ORAL_TABLET | Freq: Every day | ORAL | Status: DC
  Administered 2020-07-07: 0.6 mg via ORAL
  Filled 2020-07-06: qty 1

## 2020-07-06 MED ORDER — TRAZODONE HCL 50 MG PO TABS
25.0000 mg | ORAL_TABLET | Freq: Every evening | ORAL | Status: DC | PRN
  Administered 2020-07-09 – 2020-07-10 (×2): 25 mg via ORAL
  Filled 2020-07-06 (×2): qty 1

## 2020-07-06 MED ORDER — PANTOPRAZOLE SODIUM 40 MG PO TBEC
40.0000 mg | DELAYED_RELEASE_TABLET | Freq: Every day | ORAL | Status: DC
  Administered 2020-07-07 – 2020-07-11 (×5): 40 mg via ORAL
  Filled 2020-07-06 (×5): qty 1

## 2020-07-06 MED ORDER — INSULIN ASPART 100 UNIT/ML ~~LOC~~ SOLN: 0.0000 [IU] | Freq: Every day | SUBCUTANEOUS | Status: DC

## 2020-07-06 MED ORDER — ASPIRIN EC 81 MG PO TBEC
81.0000 mg | DELAYED_RELEASE_TABLET | Freq: Every day | ORAL | Status: DC
  Administered 2020-07-06 – 2020-07-11 (×6): 81 mg via ORAL
  Filled 2020-07-06 (×6): qty 1

## 2020-07-06 MED ORDER — ONDANSETRON HCL 4 MG PO TABS: 4.0000 mg | ORAL_TABLET | Freq: Four times a day (QID) | ORAL | Status: DC | PRN

## 2020-07-06 MED ORDER — BUPROPION HCL ER (XL) 150 MG PO TB24
150.0000 mg | ORAL_TABLET | Freq: Every day | ORAL | Status: DC
  Administered 2020-07-07 – 2020-07-11 (×5): 150 mg via ORAL
  Filled 2020-07-06 (×5): qty 1

## 2020-07-06 MED ORDER — ENOXAPARIN SODIUM 40 MG/0.4ML ~~LOC~~ SOLN
40.0000 mg | SUBCUTANEOUS | Status: DC
  Administered 2020-07-06: 40 mg via SUBCUTANEOUS
  Filled 2020-07-06: qty 0.4

## 2020-07-06 MED ORDER — CITALOPRAM HYDROBROMIDE 40 MG PO TABS
40.0000 mg | ORAL_TABLET | Freq: Every day | ORAL | Status: DC
  Administered 2020-07-07 – 2020-07-11 (×5): 40 mg via ORAL
  Filled 2020-07-06 (×5): qty 1

## 2020-07-06 MED ORDER — POLYETHYLENE GLYCOL 3350 17 G PO PACK
17.0000 g | PACK | Freq: Every day | ORAL | Status: DC | PRN
  Administered 2020-07-07: 17 g via ORAL
  Filled 2020-07-06: qty 1

## 2020-07-06 MED ORDER — ACETAMINOPHEN 325 MG PO TABS
650.0000 mg | ORAL_TABLET | Freq: Four times a day (QID) | ORAL | Status: DC | PRN
  Administered 2020-07-08 – 2020-07-10 (×3): 650 mg via ORAL
  Filled 2020-07-06 (×3): qty 2

## 2020-07-06 MED ORDER — SODIUM CHLORIDE 0.9 % IV BOLUS
1000.0000 mL | Freq: Once | INTRAVENOUS | Status: AC
  Administered 2020-07-06: 1000 mL via INTRAVENOUS

## 2020-07-06 MED ORDER — METOPROLOL TARTRATE 50 MG PO TABS
50.0000 mg | ORAL_TABLET | Freq: Two times a day (BID) | ORAL | Status: DC
  Administered 2020-07-07 – 2020-07-11 (×9): 50 mg via ORAL
  Filled 2020-07-06 (×9): qty 1

## 2020-07-06 MED ORDER — ENALAPRIL MALEATE 5 MG PO TABS
10.0000 mg | ORAL_TABLET | Freq: Every day | ORAL | Status: DC
  Administered 2020-07-07 – 2020-07-11 (×5): 10 mg via ORAL
  Filled 2020-07-06 (×6): qty 2

## 2020-07-06 MED ORDER — INSULIN ASPART 100 UNIT/ML ~~LOC~~ SOLN
0.0000 [IU] | Freq: Three times a day (TID) | SUBCUTANEOUS | Status: DC
  Administered 2020-07-07: 1 [IU] via SUBCUTANEOUS

## 2020-07-06 MED ORDER — AMLODIPINE BESYLATE 5 MG PO TABS
5.0000 mg | ORAL_TABLET | Freq: Every day | ORAL | Status: DC
  Administered 2020-07-07 – 2020-07-11 (×5): 5 mg via ORAL
  Filled 2020-07-06 (×5): qty 1

## 2020-07-06 MED ORDER — ATORVASTATIN CALCIUM 80 MG PO TABS
80.0000 mg | ORAL_TABLET | Freq: Every day | ORAL | Status: DC
  Administered 2020-07-07 – 2020-07-11 (×5): 80 mg via ORAL
  Filled 2020-07-06 (×5): qty 1

## 2020-07-06 NOTE — ED Provider Notes (Signed)
MSE was initiated and I personally evaluated the patient and placed orders (if any) at  3:11 PM on July 06, 2020.  The patient appears stable so that the remainder of the MSE may be completed by another provider.  BP (!) 91/55 (BP Location: Right Arm)   Pulse 71   Temp 98.1 F (36.7 C) (Oral)   Resp 15   SpO2 98%    Patient placed in Quick Look pathway, seen and evaluated   Chief Complaint: weakness flu like sxs  HPI:   patient who presents with known Covid exposure her daughter is currently hospitalized with Covid.  She and her daughter were exposed from another daughter about 2 weeks ago.  She has had 2 weeks of nausea, vomiting, poor appetite, myalgias, cough and fever.  She feels her symptoms are worsening.  Today she felt very weak and like she is going to pass out when she stands.  She had been able to drink water but has not been able to eat much..  ROS: Positive for cough and fevers (one)  Physical Exam:   Gen: No distress  Neuro: Awake and Alert  Skin: Warm    Focused Exam: Patient is ill-appearing, no wheezing.  Patient with hypertension. Multiple comorbidities present.  I have ordered labs.   Initiation of care has begun. The patient has been counseled on the process, plan, and necessity for staying for the completion/evaluation, and the remainder of the medical screening examination    Arthor Captain, PA-C 07/06/20 1529    Terrilee Files, MD 07/06/20 [phone]

## 2020-07-06 NOTE — H&P (Signed)
[facility] History and Physical  Tocara Mennen ZCH:[id number] DOB: Oct 28, 1961 DOA: 07/06/2020  Referring physician: Evelena Leyden, PA-C PCP: Earnestine Leys, MD   Chief Complaint: Weakness  HPI: Phyllis Miller is a 59 y.o. female with a hx of CKD III (b/l Cr 1.4-1.6), HTN, HLD, non-insulin dependent DM2, OSA, who presents with concerns regarding COVID exposure and feeling ill.   Patient reports both daughters have tested positive for COVID and one of the is currently hospitalized for that reason She has felt ill for the past 14 days Daughter started coughing and feeling ill while they were together watching TV A few days later both her and another daughter who was present also started to feel ill First daughter is currently admitted to [facility] on 5th floor with COVID pneumonia Symptoms initially had cough, night sweats, aches in her legs, diarrhea, and poor appetite Currently just feels weak and fatigued, but denies shortness of breath, chest pain, fevers Neither patient or her daughters have had a COVID vaccine  In the ED her labs were notable for a positive COVID swab, AKI with Cr 3.74 but otherwise unremarkable CMP, unremarkable CBC, normal CXR, and unremarkable ECG. She was admitted for AKI and given 1L NS in the ED, no further treatment given for COVID given patient not hypoxic.   Chart Review - multiple notes from PCP noting c/f COVID symptoms, declining vaccination - last admission 08/2018, admitted for chest pain with negative nuclear stress test at that time  Review of Systems:  Pertinent positives and negative per HPI, all others reviewed and negative   Past Medical History:  Diagnosis Date  . Asthma   . Bronchitis   . CKD (chronic kidney disease), stage III   . Depression   . Heart palpitations   . Hypercholesteremia   . Hypertension   . Morbid obesity ([facility])   . Sleep apnea    Past Surgical History:  Procedure Laterality Date  . ABDOMINAL HYSTERECTOMY    .  TONSILLECTOMY     Social History:  reports that she quit smoking about 6 years ago. Her smoking use included cigarettes. She has never used smokeless tobacco. She reports that she does not drink alcohol and does not use drugs.  No Known Allergies  Family History  Problem Relation Age of Onset  . CAD Brother      Prior to Admission medications   Medication Sig Start Date End Date Taking? Authorizing Provider  albuterol (PROAIR HFA) 108 (90 Base) MCG/ACT inhaler Inhale 2 puffs into the lungs every 6 (six) hours as needed for wheezing or shortness of breath.  02/29/16 07/06/20  [provider]  allopurinol (ZYLOPRIM) 300 MG tablet Take 300 mg by mouth daily.  07/29/18 07/06/20  [provider]  amLODipine (NORVASC) 5 MG tablet Take 5 mg by mouth daily.  05/03/16 07/06/20  [provider]  aspirin EC 81 MG tablet Take 81 mg by mouth daily.     [provider]  buPROPion (WELLBUTRIN XL) 150 MG 24 hr tablet Take 150 mg by mouth daily.     [provider]  citalopram (CELEXA) 40 MG tablet Take 40 mg by mouth daily.  08/26/10   [provider]  Colchicine 0.6 MG CAPS Take 0.6 mg by mouth daily.  02/05/18   [provider]  enalapril (VASOTEC) 10 MG tablet Take 10 mg by mouth daily.  04/13/16   [provider]  famotidine (PEPCID) 20 MG tablet Take 20 mg by mouth daily.  08/20/12   [provider]  metoprolol tartrate (LOPRESSOR) 50 MG tablet Take 50 mg by mouth 2 (two) times daily.  05/31/18 07/06/20  [provider]  omeprazole (PRILOSEC) 20 MG capsule Take 1 capsule (20 mg total) by mouth 2 (two) times daily before a meal. 03/28/20   Fayrene Helper, PA-C  polyethylene glycol (MIRALAX / GLYCOLAX) packet Take 17 g by mouth daily as needed for moderate constipation.  08/30/17   [provider]  simvastatin (ZOCOR) 20 MG tablet Take 20 mg by mouth daily.  01/04/16 07/06/20  [provider]   Physical Exam: Vitals:    07/06/20 1630 07/06/20 1700 07/06/20 2013 07/06/20 2014  BP: 136/76 131/80 (!) 123/101   Pulse: 76 78 90 85  Resp: (!) 22 (!) 23    Temp:      TempSrc:      SpO2: 98% 99% 92% 97%    Wt Readings from Last 3 Encounters:  08/21/18 125.6 kg  07/24/15 123.8 kg    General:  Appears calm and comfortable Eyes: PERRL, normal lids, irises & conjunctiva ENT: grossly normal hearing, lips & tongue Cardiovascular: RRR, no m/r/g. Trace LE edema Respiratory: CTA bilaterally, no w/r/r. Normal respiratory effort. Abdomen: soft, ntnd Skin: no rash or induration seen on limited exam Musculoskeletal: grossly normal tone BUE/BLE Psychiatric: grossly normal mood and affect, speech fluent and appropriate Neurologic: grossly non-focal.          Labs on Admission:  Basic Metabolic Panel: Recent Labs  Lab 07/06/20 1417  NA 141  K 4.5  CL 109  CO2 20*  GLUCOSE 103*  BUN 68*  CREATININE 3.74*  CALCIUM 8.8*   Liver Function Tests: Recent Labs  Lab 07/06/20 1417  AST 40  ALT 35  ALKPHOS 68  BILITOT 0.6  PROT 7.0  ALBUMIN 2.5*   Recent Labs  Lab 07/06/20 1417  LIPASE 30   No results for input(s): AMMONIA in the last 168 hours. CBC: Recent Labs  Lab 07/06/20 1417  WBC 6.5  NEUTROABS 3.6  HGB 14.0  HCT 44.8  MCV 86.2  PLT 348   Cardiac Enzymes: No results for input(s): CKTOTAL, CKMB, CKMBINDEX, TROPONINI in the last 168 hours.  BNP (last 3 results) No results for input(s): BNP in the last 8760 hours.  ProBNP (last 3 results) No results for input(s): PROBNP in the last 8760 hours.  CBG: No results for input(s): GLUCAP in the last 168 hours.  Radiological Exams on Admission: DG Chest Port 1 View  Result Date: 07/06/2020 CLINICAL DATA:  Cough. EXAM: PORTABLE CHEST 1 VIEW COMPARISON:  March 28, 2020. FINDINGS: Stable cardiomediastinal silhouette. No pneumothorax or pleural effusion is noted. Lungs are clear. Bony thorax is unremarkable. IMPRESSION: No active disease.  Electronically Signed   By: Lupita Raider M.D.   On: 07/06/2020 14:43     EKG: Independently reviewed. NSR, no ischemic changes, overall unremarkable.  Assessment/Plan Active Problems:   Essential hypertension   Hyperlipidemia   Diabetes mellitus type 2 in obese ([facility])   AKI (acute kidney injury) ([facility])   COVID-19 virus infection   #AKI #Acute on chronic kidney disease Cr increased from baseline of 1.4-1.6 (last check four months prior) to 3.74 in setting of non-severe COVID-19 illness. Suspect pre-renal given report of poor PO intake over the past two weeks. Has received 1L NS in ED, will order for additional liter and reassess in the AM. - IVF resuscitation - trend CMP  #COVID-19 Infection Non-severe infection,  appears to be approximately day 14 since infection so outside of the window for expected severe disease. Currently afebrile, without hypoxia, on my exam without tachypnea, and overall well appearing. Will defer any COVID specific treatment at this time. Strongly urged patient to receive the COVID vaccine once she has fully recovered, she says she will consider doing so.   #Known Medical Problems DM2: not on any meds at present, QID sugar checks with ISS HTN: cont amlodipine 5 daily, enalapril 10 daily, metoprolol tartrate 50 BID Gout: cont colchicine, reports she is not taking allopurinol Anxiety/depression: cont wellbutrin 150 daily, celexa 40 daily HLD: cont atorvastatin 80 daily GERD: cont PPI daily   Code Status: Full Code, not confirmed DVT Prophylaxis: Lovenox  Family Communication: none Disposition Plan: Obs, likely d/c in AM  Time spent: 50 minutes  Venora Maples MD/MPH [facility]

## 2020-07-06 NOTE — ED Triage Notes (Signed)
To ED via [facility] for eval of 2 wks of covid symptoms- diarrhea, sob, cough. Pt's daughter is in [facility] admitted with Covid. Pt denies taking OTC meds. Pt speaks in full complete sentences. Appears in nad

## 2020-07-06 NOTE — ED Provider Notes (Signed)
[facility] EMERGENCY DEPARTMENT Provider Note   CSN: [id number] Arrival date & time: 07/06/20  1115     History Chief Complaint  Patient presents with  . Covid Exposure    Phyllis Miller is a 59 y.o. female with PMH significant for HTN, HLD, stage III CKD, OSA, and asthma presents the ED with complaints of COVID-19 exposure.  She has two daughters who have tested positive and one of them is currently admitted for COVID-19 related pneumonia.  Patient reports that for the past 14 days she is feeling generalized weakness, diminished appetite, body aches, sinus congestion, and has recently developed loose nonbloody stools.  She states that she has been continue to drink fluids, but has not been eating well.  Patient is followed by Dr.  Gwinda Passe, [facility], and she has a scheduled video visit for tomorrow.  Her most recent telemedicine note on 06/23/2020 notes her vaccine hesitancy.  Despite her numerous risk factors for severe disease, she has distressed in the medical system and on my examination states that she would not receive the COVID-19 vaccination even if she is COVID-19 negative here today, despite her daughter's hospitalization.  Patient is also endorsing intermittent subjective fevers and chills at home as she does not have a thermometer.  She denies any chest pain, shortness of breath, cough, sore throat or difficulty swallowing, abdominal pain, nausea or vomiting, numbness, dizziness, or urinary symptoms.  No history of clots.  HPI     Past Medical History:  Diagnosis Date  . Asthma   . Bronchitis   . CKD (chronic kidney disease), stage III   . Depression   . Heart palpitations   . Hypercholesteremia   . Hypertension   . Morbid obesity (HCC)   . Sleep apnea     Patient Active Problem List   Diagnosis Date Noted  . Chest pain 08/21/2018  . Essential hypertension 08/21/2018  . Hyperlipidemia 08/21/2018  . Diabetes mellitus type 2 in obese  (HCC) 08/21/2018    Past Surgical History:  Procedure Laterality Date  . ABDOMINAL HYSTERECTOMY    . TONSILLECTOMY       OB History   No obstetric history on file.     Family History  Problem Relation Age of Onset  . CAD Brother     Social History   Tobacco Use  . Smoking status: Former Smoker    Types: Cigarettes    Quit date: 07/19/2013    Years since quitting: 6.9  . Smokeless tobacco: Never Used  Substance Use Topics  . Alcohol use: No  . Drug use: Never    Home Medications Prior to Admission medications   Medication Sig Start Date End Date Taking? Authorizing Provider  albuterol (PROAIR HFA) 108 (90 Base) MCG/ACT inhaler Inhale 2 puffs into the lungs every 6 (six) hours as needed for wheezing or shortness of breath.  02/29/16 07/06/20  [provider]  allopurinol (ZYLOPRIM) 300 MG tablet Take 300 mg by mouth daily.  07/29/18 07/06/20  [provider]  amLODipine (NORVASC) 5 MG tablet Take 5 mg by mouth daily.  05/03/16 07/06/20  [provider]  aspirin EC 81 MG tablet Take 81 mg by mouth daily.     [provider]  buPROPion (WELLBUTRIN XL) 150 MG 24 hr tablet Take 150 mg by mouth daily.     [provider]  citalopram (CELEXA) 40 MG tablet Take 40 mg by mouth daily.  08/26/10   [provider]  Colchicine 0.6 MG CAPS Take 0.6 mg by mouth daily.  02/05/18   [provider]  enalapril (VASOTEC) 10 MG tablet Take 10 mg by mouth daily.  04/13/16   [provider]  famotidine (PEPCID) 20 MG tablet Take 20 mg by mouth daily.  08/20/12   [provider]  metoprolol tartrate (LOPRESSOR) 50 MG tablet Take 50 mg by mouth 2 (two) times daily.  05/31/18 07/06/20  [provider]  omeprazole (PRILOSEC) 20 MG capsule Take 1 capsule (20 mg total) by mouth 2 (two) times daily before a meal. 03/28/20   Fayrene Helper, PA-C  polyethylene glycol (MIRALAX / GLYCOLAX) packet Take 17 g by mouth daily as needed for  moderate constipation.  08/30/17   [provider]  simvastatin (ZOCOR) 20 MG tablet Take 20 mg by mouth daily.  01/04/16 07/06/20  [provider]    Allergies    Patient has no known allergies.  Review of Systems   Review of Systems  All other systems reviewed and are negative.   Physical Exam Updated Vital Signs BP 131/80   Pulse 78   Temp 98.1 F (36.7 C) (Oral)   Resp (!) 23   SpO2 99%   Physical Exam Vitals and nursing note reviewed. Exam conducted with a chaperone present.  Constitutional:      General: She is not in acute distress.    Appearance: She is obese.  HENT:     Head: Normocephalic and atraumatic.  Eyes:     General: No scleral icterus.    Conjunctiva/sclera: Conjunctivae normal.  Cardiovascular:     Rate and Rhythm: Normal rate and regular rhythm.     Pulses: Normal pulses.     Heart sounds: Normal heart sounds.  Pulmonary:     Comments: No increased respiratory effort.  No accessory muscle use.  Speaks in full sentences.  No wheezing, rales, or other abnormal breath sounds appreciated.  Symmetric chest rise. Abdominal:     General: Abdomen is flat. There is no distension.     Palpations: Abdomen is soft.     Tenderness: There is no abdominal tenderness.  Musculoskeletal:     Cervical back: Normal range of motion and neck supple. No rigidity.     Right lower leg: No edema.     Left lower leg: No edema.  Skin:    General: Skin is dry.     Capillary Refill: Capillary refill takes less than 2 seconds.  Neurological:     Mental Status: She is alert.     GCS: GCS eye subscore is 4. GCS verbal subscore is 5. GCS motor subscore is 6.     Comments: Moves all extremities.  Can ambulate without ataxia or other difficulty.  PERRL and EOM intact.  Sensation intact throughout.  Psychiatric:        Mood and Affect: Mood normal.        Behavior: Behavior normal.        Thought Content: Thought content normal.     ED Results / Procedures /  Treatments   Labs (all labs ordered are listed, but only abnormal results are displayed) Labs Reviewed  SARS CORONAVIRUS 2 BY RT PCR (HOSPITAL ORDER, PERFORMED IN [facility]) - Abnormal; Notable for the following components:      Result Value   SARS Coronavirus 2 POSITIVE (*)    All other components within normal limits  COMPREHENSIVE METABOLIC PANEL - Abnormal; Notable for the following components:  CO2 20 (*)    Glucose, Bld 103 (*)    BUN 68 (*)    Creatinine, Ser 3.74 (*)    Calcium 8.8 (*)    Albumin 2.5 (*)    GFR calc non Af Amer 13 (*)    GFR calc Af Amer 15 (*)    All other components within normal limits  CBC WITH DIFFERENTIAL/PLATELET - Abnormal; Notable for the following components:   RBC 5.20 (*)    Abs Immature Granulocytes 0.09 (*)    All other components within normal limits  URINALYSIS, ROUTINE W REFLEX MICROSCOPIC - Abnormal; Notable for the following components:   APPearance HAZY (*)    Hgb urine dipstick SMALL (*)    Protein, ur >=300 (*)    Bacteria, UA FEW (*)    All other components within normal limits  LIPASE, BLOOD  LACTIC ACID, PLASMA    EKG None  Radiology DG Chest Port 1 View  Result Date: 07/06/2020 CLINICAL DATA:  Cough. EXAM: PORTABLE CHEST 1 VIEW COMPARISON:  March 28, 2020. FINDINGS: Stable cardiomediastinal silhouette. No pneumothorax or pleural effusion is noted. Lungs are clear. Bony thorax is unremarkable. IMPRESSION: No active disease. Electronically Signed   By: Lupita Raider M.D.   On: 07/06/2020 14:43    Procedures Procedures (including critical care time)  Medications Ordered in ED Medications  sodium chloride 0.9 % bolus 1,000 mL (1,000 mLs Intravenous New Bag/Given 07/06/20 1638)    ED Course  I have reviewed the triage vital signs and the nursing notes.  Pertinent labs & imaging results that were available during my care of the patient were reviewed by me and considered in my medical decision making (see  chart for details).  Clinical Course as of Jul 06 1849  Tue Jul 06, 2020  1846 Spoke with hospitalist to admit patient for AKI in the setting of the COVID-19 infection.   [GG]    Clinical Course User Index [GG] Lorelee New, PA-C   MDM Rules/Calculators/A&P                          Patient presents to the ED with history and physical exam consistent with viral illness.  Given her close COVID-19 exposure, COVID-19 was likely diagnosis.  However, given her weakness and soft blood pressures, obtained laboratory work-up including lactic acid.  CMP is notable for AKI with creatinine elevated 3.74 and GFR reduced to 15.  These labs are worsened when compared to labs obtained 3 months ago that had creatinine and GFR of 1.67 and 39 respectively.  Her BUN is also significantly elevated at 68.  Given her reported diminished p.o. intake and recent loose stools, suspect this is likely a prerenal azotemia.  While she is COVID-19 positive and fluids should be judicious, I feel as though it is reasonable to replenish with 1 L IV NS.  Patient had cardiac stress testing and work-up performed 08/22/2018 that revealed normal ejection fraction and no ST segment deviation during stress.  She is denying any chest pain or difficulty breathing at this time.  No orthopnea.  Her plain films are personally reviewed and there is no evidence of pneumonia or other acute cardiopulmonary disease.  On reexamination after fluids were administered, patient's blood pressure has normalized.  The remainder of her laboratory work-up is largely unremarkable.  Do not feel as though further imaging is warranted.  Given the severity of her AKI, discussed with Dr. Pilar Plate  and feel as though patient should be admitted for observation and laboratory recheck.  She was soft on her blood pressure earlier, but that seems to have improved.  No acute distress.  Will consult unassigned medicine for admission.    Spoke with hospitalist to admit patient  for AKI in the setting of the COVID-19 infection.  Jarome MatinDeborah Pann was evaluated in Emergency Department on 07/06/2020 for the symptoms described in the history of present illness. She was evaluated in the context of the global COVID-19 pandemic, which necessitated consideration that the patient might be at risk for infection with the SARS-CoV-2 virus that causes COVID-19. Institutional protocols and algorithms that pertain to the evaluation of patients at risk for COVID-19 are in a state of rapid change based on information released by regulatory bodies including the CDC and federal and state organizations. These policies and algorithms were followed during the patient's care in the ED.  Final Clinical Impression(s) / ED Diagnoses Final diagnoses:  Acute kidney injury [facility](HCC)  COVID-19    Rx / DC Orders ED Discharge Orders    None       Lorelee NewGreen, Takerra Lupinacci L, PA-C 07/06/20 1850    Sabas SousBero, Michael M, MD 07/06/20 [phone]

## 2020-07-06 NOTE — ED Notes (Signed)
CBG by EMS was 110

## 2020-07-06 NOTE — ED Notes (Signed)
Pt reports she is unable to stand for orthostatic vitals.

## 2020-07-07 ENCOUNTER — Encounter (HOSPITAL_COMMUNITY): Payer: Self-pay | Admitting: Family Medicine

## 2020-07-07 ENCOUNTER — Other Ambulatory Visit: Payer: Self-pay

## 2020-07-07 DIAGNOSIS — U071 COVID-19: Secondary | ICD-10-CM | POA: Diagnosis not present

## 2020-07-07 DIAGNOSIS — Z8249 Family history of ischemic heart disease and other diseases of the circulatory system: Secondary | ICD-10-CM | POA: Diagnosis not present

## 2020-07-07 DIAGNOSIS — E875 Hyperkalemia: Secondary | ICD-10-CM | POA: Diagnosis present

## 2020-07-07 DIAGNOSIS — N183 Chronic kidney disease, stage 3 unspecified: Secondary | ICD-10-CM | POA: Diagnosis present

## 2020-07-07 DIAGNOSIS — Z79899 Other long term (current) drug therapy: Secondary | ICD-10-CM | POA: Diagnosis not present

## 2020-07-07 DIAGNOSIS — E7800 Pure hypercholesterolemia, unspecified: Secondary | ICD-10-CM | POA: Diagnosis not present

## 2020-07-07 DIAGNOSIS — E785 Hyperlipidemia, unspecified: Secondary | ICD-10-CM | POA: Diagnosis present

## 2020-07-07 DIAGNOSIS — K219 Gastro-esophageal reflux disease without esophagitis: Secondary | ICD-10-CM | POA: Diagnosis present

## 2020-07-07 DIAGNOSIS — N1832 Chronic kidney disease, stage 3b: Secondary | ICD-10-CM | POA: Diagnosis present

## 2020-07-07 DIAGNOSIS — M109 Gout, unspecified: Secondary | ICD-10-CM | POA: Diagnosis present

## 2020-07-07 DIAGNOSIS — F329 Major depressive disorder, single episode, unspecified: Secondary | ICD-10-CM | POA: Diagnosis present

## 2020-07-07 DIAGNOSIS — G4733 Obstructive sleep apnea (adult) (pediatric): Secondary | ICD-10-CM | POA: Diagnosis present

## 2020-07-07 DIAGNOSIS — Z87891 Personal history of nicotine dependence: Secondary | ICD-10-CM | POA: Diagnosis not present

## 2020-07-07 DIAGNOSIS — E1169 Type 2 diabetes mellitus with other specified complication: Secondary | ICD-10-CM | POA: Diagnosis not present

## 2020-07-07 DIAGNOSIS — E1122 Type 2 diabetes mellitus with diabetic chronic kidney disease: Secondary | ICD-10-CM | POA: Diagnosis present

## 2020-07-07 DIAGNOSIS — I129 Hypertensive chronic kidney disease with stage 1 through stage 4 chronic kidney disease, or unspecified chronic kidney disease: Secondary | ICD-10-CM | POA: Diagnosis present

## 2020-07-07 DIAGNOSIS — Z7982 Long term (current) use of aspirin: Secondary | ICD-10-CM | POA: Diagnosis not present

## 2020-07-07 DIAGNOSIS — N179 Acute kidney failure, unspecified: Secondary | ICD-10-CM | POA: Diagnosis present

## 2020-07-07 DIAGNOSIS — F419 Anxiety disorder, unspecified: Secondary | ICD-10-CM | POA: Diagnosis present

## 2020-07-07 DIAGNOSIS — E662 Morbid (severe) obesity with alveolar hypoventilation: Secondary | ICD-10-CM

## 2020-07-07 DIAGNOSIS — R519 Headache, unspecified: Secondary | ICD-10-CM | POA: Diagnosis present

## 2020-07-07 DIAGNOSIS — J45909 Unspecified asthma, uncomplicated: Secondary | ICD-10-CM | POA: Diagnosis present

## 2020-07-07 DIAGNOSIS — N1831 Chronic kidney disease, stage 3a: Secondary | ICD-10-CM | POA: Diagnosis present

## 2020-07-07 DIAGNOSIS — I10 Essential (primary) hypertension: Secondary | ICD-10-CM | POA: Diagnosis not present

## 2020-07-07 LAB — CBC WITH DIFFERENTIAL/PLATELET
Abs Immature Granulocytes: 0.12 10*3/uL — ABNORMAL HIGH (ref 0.00–0.07)
Basophils Absolute: 0 10*3/uL (ref 0.0–0.1)
Basophils Relative: 0 %
Eosinophils Absolute: 0.2 10*3/uL (ref 0.0–0.5)
Eosinophils Relative: 4 %
HCT: 41.7 % (ref 36.0–46.0)
Hemoglobin: 13.3 g/dL (ref 12.0–15.0)
Immature Granulocytes: 2 %
Lymphocytes Relative: 30 %
Lymphs Abs: 1.7 10*3/uL (ref 0.7–4.0)
MCH: 27 pg (ref 26.0–34.0)
MCHC: 31.9 g/dL (ref 30.0–36.0)
MCV: 84.8 fL (ref 80.0–100.0)
Monocytes Absolute: 1 10*3/uL (ref 0.1–1.0)
Monocytes Relative: 17 %
Neutro Abs: 2.7 10*3/uL (ref 1.7–7.7)
Neutrophils Relative %: 47 %
Platelets: 308 10*3/uL (ref 150–400)
RBC: 4.92 MIL/uL (ref 3.87–5.11)
RDW: 14.6 % (ref 11.5–15.5)
WBC: 5.7 10*3/uL (ref 4.0–10.5)
nRBC: 0 % (ref 0.0–0.2)

## 2020-07-07 LAB — GLUCOSE, CAPILLARY
Glucose-Capillary: 102 mg/dL — ABNORMAL HIGH (ref 70–99)
Glucose-Capillary: 150 mg/dL — ABNORMAL HIGH (ref 70–99)
Glucose-Capillary: 110 mg/dL — ABNORMAL HIGH (ref 70–99)
Glucose-Capillary: 134 mg/dL — ABNORMAL HIGH (ref 70–99)

## 2020-07-07 LAB — MAGNESIUM: Magnesium: 2.3 mg/dL (ref 1.7–2.4)

## 2020-07-07 LAB — COMPREHENSIVE METABOLIC PANEL
ALT: 38 U/L (ref 0–44)
AST: 40 U/L (ref 15–41)
Albumin: 2.3 g/dL — ABNORMAL LOW (ref 3.5–5.0)
Alkaline Phosphatase: 60 U/L (ref 38–126)
Anion gap: 9 (ref 5–15)
BUN: 58 mg/dL — ABNORMAL HIGH (ref 6–20)
CO2: 20 mmol/L — ABNORMAL LOW (ref 22–32)
Calcium: 8.7 mg/dL — ABNORMAL LOW (ref 8.9–10.3)
Chloride: 112 mmol/L — ABNORMAL HIGH (ref 98–111)
Creatinine, Ser: 3.36 mg/dL — ABNORMAL HIGH (ref 0.44–1.00)
GFR calc Af Amer: 17 mL/min — ABNORMAL LOW (ref >60)
GFR calc non Af Amer: 15 mL/min — ABNORMAL LOW (ref >60)
Glucose, Bld: 139 mg/dL — ABNORMAL HIGH (ref 70–99)
Potassium: 4.1 mmol/L (ref 3.5–5.1)
Sodium: 141 mmol/L (ref 135–145)
Total Bilirubin: 0.7 mg/dL (ref 0.3–1.2)
Total Protein: 6.4 g/dL — ABNORMAL LOW (ref 6.5–8.1)

## 2020-07-07 LAB — D-DIMER, QUANTITATIVE: D-Dimer, Quant: 0.46 ug/mL-FEU (ref 0.00–0.50)

## 2020-07-07 LAB — HIV ANTIBODY (ROUTINE TESTING W REFLEX): HIV Screen 4th Generation wRfx: NONREACTIVE

## 2020-07-07 LAB — C-REACTIVE PROTEIN: CRP: 1.7 mg/dL — ABNORMAL HIGH (ref <1.0)

## 2020-07-07 LAB — PHOSPHORUS: Phosphorus: 3.7 mg/dL (ref 2.5–4.6)

## 2020-07-07 LAB — LACTATE DEHYDROGENASE: LDH: 252 U/L — ABNORMAL HIGH (ref 98–192)

## 2020-07-07 LAB — FERRITIN: Ferritin: 493 ng/mL — ABNORMAL HIGH (ref 11–307)

## 2020-07-07 MED ORDER — DEXTROSE-NACL 5-0.9 % IV SOLN: INTRAVENOUS | Status: DC

## 2020-07-07 MED ORDER — COLCHICINE 0.6 MG PO TABS
0.3000 mg | ORAL_TABLET | Freq: Every day | ORAL | Status: DC
  Administered 2020-07-08 – 2020-07-11 (×4): 0.3 mg via ORAL
  Filled 2020-07-07 (×5): qty 0.5

## 2020-07-07 MED ORDER — HEPARIN SODIUM (PORCINE) 5000 UNIT/ML IJ SOLN
5000.0000 [IU] | Freq: Three times a day (TID) | INTRAMUSCULAR | Status: DC
  Administered 2020-07-07 – 2020-07-11 (×11): 5000 [IU] via SUBCUTANEOUS
  Filled 2020-07-07 (×11): qty 1

## 2020-07-07 NOTE — Progress Notes (Addendum)
PROGRESS NOTE    Phyllis Miller  EXN:[id number] DOB: 24-Jan-1961 DOA: 07/06/2020 PCP: Earnestine Leys, MD     Brief Narrative:  59 y.o. BF PMHx  CKD III (b/l Cr 1.4-1.6), HTN, HLD, DM type II, OSA/OHS,, MORBIDLY OBESE  Presents with concerns regarding COVID exposure and feeling ill.   Patient reports both daughters have tested positive for COVID and one of the is currently hospitalized for that reason She has felt ill for the past 14 days Daughter started coughing and feeling ill while they were together watching TV A few days later both her and another daughter who was present also started to feel ill First daughter is currently admitted to [facility] on 5th floor with COVID pneumonia Symptoms initially had cough, night sweats, aches in her legs, diarrhea, and poor appetite Currently just feels weak and fatigued, but denies shortness of breath, chest pain, fevers Neither patient or her daughters have had a COVID vaccine  In the ED her labs were notable for a positive COVID swab, AKI with Cr 3.74 but otherwise unremarkable CMP, unremarkable CBC, normal CXR, and unremarkable ECG. She was admitted for AKI and given 1L NS in the ED, no further treatment given for COVID given patient not hypoxic.     Subjective: A/O x4, negative CP, states slightly dizzy, negative nausea, negative vomiting, negative abdominal pain.  Does not appear to truly believe in Covid.   Assessment & Plan: Covid vaccination; no Vaccine   Active Problems:   Essential hypertension   Hyperlipidemia   Diabetes mellitus type 2 in obese (HCC)   AKI (acute kidney injury) (HCC)   COVID-19 virus infection   Morbidly obese (HCC)   Obesity hypoventilation syndrome (HCC)   Obstructive sleep apnea   CKD (chronic kidney disease), stage III   Acute renal failure superimposed on stage 3a chronic kidney disease (HCC)  Covid infection COVID-19 Labs  Recent Labs    07/07/20 1143  DDIMER 0.46  FERRITIN 493*  LDH 252*    CRP 1.7*    Lab Results  Component Value Date   SARSCOV2NAA POSITIVE (A) 07/06/2020  -Currently patient on room air, and CRP minimally elevated.  Patient however was counseled that we must watch her closely as this could change rapidly at which point we would need to start high-dose steroids, remdesivir, and possibly Actemra dependent upon her Covid inflammatory marker, and symptoms. -Consult patient on the severity of current Covid pandemic and need to obtain vaccination, especially given her multiple medical conditions once she recovers.  OSA/OHS -CPAP per respiratory   Acute on CKD stage IIIa (baseline Cr 1.4-1.6) -Multifactorial Covid infection, dehydration. Recent Labs  Lab 07/06/20 1417 07/07/20 0424  CREATININE 3.74* 3.36*  -D5-0.9% saline 116ml/hr    DM type II controlled?  Uncontrolled? -Hemoglobin A1c pending -Lipid panel pending -Sensitive SSI  HLD -Lipid panel pending -Atorvastatin 80 mg daily  Essential HTN -Amlodipine 5 mg daily -Enalapril 10 mg daily -Metoprolol 50 mg BID -  Gout  Anxiety/depression -Wellbutrin 150 mg daily -Celexa 40 mg daily      DVT prophylaxis: Subcu heparin Code Status: Full Family Communication:  Status is: Inpatient    Dispo: The patient is from: Home              Anticipated d/c is to: Home              Anticipated d/c date is: 8/10              Patient currently unstable  Consultants:    Procedures/Significant Events:    I have personally reviewed and interpreted all radiology studies and my findings are as above.  VENTILATOR SETTINGS:    Cultures 8/3 SARS Coronavirus positive   Antimicrobials:    Devices    LINES / TUBES:      Continuous Infusions: . dextrose 5 % and 0.9% NaCl 100 mL/hr at 07/07/20 1106     Objective: Vitals:   07/07/20 0732 07/07/20 1159 07/07/20 1216 07/07/20 1631  BP: 110/63   118/87  Pulse:    81  Resp: 20 20  20   Temp: 97.9 F (36.6 C) 98.3 F  (36.8 C)  98.1 F (36.7 C)  TempSrc: Oral Oral  Oral  SpO2: 96%   98%  Weight:   (!) 138.4 kg   Height:   5\' 4"  (1.626 m)     Intake/Output Summary (Last 24 hours) at 07/07/2020 1800 Last data filed at 07/07/2020 1450 Gross per 24 hour  Intake 573.69 ml  Output 250 ml  Net 323.69 ml   Filed Weights   07/07/20 0156 07/07/20 1216  Weight: (!) 138.5 kg (!) 138.4 kg    Examination:  General: A/O x4 No acute respiratory distress Eyes: negative scleral hemorrhage, negative anisocoria, negative icterus ENT: Negative Runny nose, negative gingival bleeding, Neck:  Negative scars, masses, torticollis, lymphadenopathy, JVD Lungs: Clear to auscultation bilaterally without wheezes or crackles Cardiovascular: Regular rate and rhythm without murmur gallop or rub normal S1 and S2 Abdomen: MORBIDLY OBESE abdominal pain, nondistended, positive soft, bowel sounds, no rebound, no ascites, no appreciable mass Extremities: No significant cyanosis, clubbing, or edema bilateral lower extremities Skin: Negative rashes, lesions, ulcers Psychiatric:  Negative depression, negative anxiety, negative fatigue, negative mania  Central nervous system:  Cranial nerves II through XII intact, tongue/uvula midline, all extremities muscle strength 5/5, sensation intact throughout, negative dysarthria, negative expressive aphasia, negative receptive aphasia.  .     Data Reviewed: Care during the described time interval was provided by me .  I have reviewed this patient's available data, including medical history, events of note, physical examination, and all test results as part of my evaluation.  CBC: Recent Labs  Lab 07/06/20 1417 07/07/20 0424  WBC 6.5 5.7  NEUTROABS 3.6 2.7  HGB 14.0 13.3  HCT 44.8 41.7  MCV 86.2 84.8  PLT 348 308   Basic Metabolic Panel: Recent Labs  Lab 07/06/20 1417 07/07/20 0424 07/07/20 1143  NA 141 141  --   K 4.5 4.1  --   CL 109 112*  --   CO2 20* 20*  --   GLUCOSE  103* 139*  --   BUN 68* 58*  --   CREATININE 3.74* 3.36*  --   CALCIUM 8.8* 8.7*  --   MG  --   --  2.3  PHOS  --   --  3.7   GFR: Estimated Creatinine Clearance: 26 mL/min (A) (by C-G formula based on SCr of 3.36 mg/dL (H)). Liver Function Tests: Recent Labs  Lab 07/06/20 1417 07/07/20 0424  AST 40 40  ALT 35 38  ALKPHOS 68 60  BILITOT 0.6 0.7  PROT 7.0 6.4*  ALBUMIN 2.5* 2.3*   Recent Labs  Lab 07/06/20 1417  LIPASE 30   No results for input(s): AMMONIA in the last 168 hours. Coagulation Profile: No results for input(s): INR, PROTIME in the last 168 hours. Cardiac Enzymes: No results for input(s): CKTOTAL, CKMB, CKMBINDEX, TROPONINI in the last 168 hours. BNP (  last 3 results) No results for input(s): PROBNP in the last 8760 hours. HbA1C: No results for input(s): HGBA1C in the last 72 hours. CBG: Recent Labs  Lab 07/06/20 2119 07/07/20 0719 07/07/20 1134 07/07/20 1628  GLUCAP 108* 102* 150* 110*   Lipid Profile: No results for input(s): CHOL, HDL, LDLCALC, TRIG, CHOLHDL, LDLDIRECT in the last 72 hours. Thyroid Function Tests: No results for input(s): TSH, T4TOTAL, FREET4, T3FREE, THYROIDAB in the last 72 hours. Anemia Panel: Recent Labs    07/07/20 1143  FERRITIN 493*   Sepsis Labs: Recent Labs  Lab 07/06/20 1445  LATICACIDVEN 0.8    Recent Results (from the past 240 hour(s))  SARS Coronavirus 2 by RT PCR (hospital order, [facility]) Nasopharyngeal Nasopharyngeal Swab     Status: Abnormal   Collection Time: 07/06/20  2:18 PM   Specimen: Nasopharyngeal Swab  Result Value Ref Range Status   SARS Coronavirus 2 POSITIVE (A) NEGATIVE Final    Comment: RESULT CALLED TO, READ BACK BY AND VERIFIED WITHDawna Part: T SHROPSHIRE RN 1641 07/06/20 A BROWNING (NOTE) SARS-CoV-2 target nucleic acids are DETECTED  SARS-CoV-2 RNA is generally detectable in upper respiratory specimens  during the acute phase of infection.  Positive results are  indicative  of the presence of the identified virus, but do not rule out bacterial infection or co-infection with other pathogens not detected by the test.  Clinical correlation with patient history and  other diagnostic information is necessary to determine patient infection status.  The expected result is negative.  Fact Sheet for Patients:   BoilerBrush.com.cyhttps://www.fda.gov/media/136312/download   Fact Sheet for Healthcare Providers:   https://pope.com/https://www.fda.gov/media/136313/download    This test is not yet approved or cleared by the [place] FDA and  has been authorized for detection and/or diagnosis of SARS-CoV-2 by FDA under an Emergency Use Authorization (EUA).  This EUA will remain in effect (meaning thi s test can be used) for the duration of  the COVID-19 declaration under Section 564(b)(1) of the Act, 21 U.S.C. section 360-bbb-3(b)(1), unless the authorization is terminated or revoked sooner.  Performed at [facility], [address] [place], [place] [address]          Radiology Studies: DG Chest Port 1 View  Result Date: 07/06/2020 CLINICAL DATA:  Cough. EXAM: PORTABLE CHEST 1 VIEW COMPARISON:  March 28, 2020. FINDINGS: Stable cardiomediastinal silhouette. No pneumothorax or pleural effusion is noted. Lungs are clear. Bony thorax is unremarkable. IMPRESSION: No active disease. Electronically Signed   By: Lupita RaiderJames  Green Jr M.D.   On: 07/06/2020 14:43        Scheduled Meds: . amLODipine  5 mg Oral Daily  . aspirin EC  81 mg Oral Daily  . atorvastatin  80 mg Oral Daily  . buPROPion  150 mg Oral Daily  . citalopram  40 mg Oral Daily  . [START ON 07/08/2020] colchicine  0.3 mg Oral Daily  . enalapril  10 mg Oral Daily  . heparin injection (subcutaneous)  5,000 Units Subcutaneous Q8H  . insulin aspart  0-5 Units Subcutaneous QHS  . insulin aspart  0-9 Units Subcutaneous TID WC  . metoprolol tartrate  50 mg Oral BID  . pantoprazole  40 mg Oral Daily   Continuous  Infusions: . dextrose 5 % and 0.9% NaCl 100 mL/hr at 07/07/20 1106     LOS: 0 days    Time spent:40 min    Chong Wojdyla, Roselind MessierURTIS J, MD [facility] Pager [phone]  If  7PM-7AM, please contact night-coverage [email or web address] Password [email or web address] 07/07/2020, 6:00 PM

## 2020-07-07 NOTE — Plan of Care (Signed)
  Problem: Coping: Goal: Psychosocial and spiritual needs will be supported 07/07/2020 0246 by Lawrence Marseilles, RN Outcome: Progressing 07/07/2020 0146 by Lawrence Marseilles, RN Outcome: Progressing   Problem: Respiratory: Goal: Will maintain a patent airway 07/07/2020 0246 by Lawrence Marseilles, RN Outcome: Progressing 07/07/2020 0146 by Lawrence Marseilles, RN Outcome: Progressing   Problem: Education: Goal: Knowledge of General Education information will improve Description: Including pain rating scale, medication(s)/side effects and non-pharmacologic comfort measures Outcome: Progressing   Problem: Safety: Goal: Ability to remain free from injury will improve Outcome: Progressing

## 2020-07-07 NOTE — Progress Notes (Signed)
Pt reports using CPAP for HS at home; MD paged to place order yet Respiratory states they do not setup CPAP on our unit. Pt. Placed on 2 LPM oxygen via nasal cannula and head of bed elevated.

## 2020-07-07 NOTE — Plan of Care (Signed)
  Problem: Coping: Goal: Psychosocial and spiritual needs will be supported Outcome: Progressing   Problem: Respiratory: Goal: Will maintain a patent airway Outcome: Progressing   Problem: Safety: Goal: Ability to remain free from injury will improve Outcome: Progressing   

## 2020-07-08 LAB — COMPREHENSIVE METABOLIC PANEL
ALT: 38 U/L (ref 0–44)
AST: 34 U/L (ref 15–41)
Albumin: 2.2 g/dL — ABNORMAL LOW (ref 3.5–5.0)
Alkaline Phosphatase: 57 U/L (ref 38–126)
Anion gap: 9 (ref 5–15)
BUN: 44 mg/dL — ABNORMAL HIGH (ref 6–20)
CO2: 20 mmol/L — ABNORMAL LOW (ref 22–32)
Calcium: 8.7 mg/dL — ABNORMAL LOW (ref 8.9–10.3)
Chloride: 116 mmol/L — ABNORMAL HIGH (ref 98–111)
Creatinine, Ser: 2.75 mg/dL — ABNORMAL HIGH (ref 0.44–1.00)
GFR calc Af Amer: 21 mL/min — ABNORMAL LOW (ref >60)
GFR calc non Af Amer: 19 mL/min — ABNORMAL LOW (ref >60)
Glucose, Bld: 130 mg/dL — ABNORMAL HIGH (ref 70–99)
Potassium: 4.7 mmol/L (ref 3.5–5.1)
Sodium: 145 mmol/L (ref 135–145)
Total Bilirubin: 0.5 mg/dL (ref 0.3–1.2)
Total Protein: 6 g/dL — ABNORMAL LOW (ref 6.5–8.1)

## 2020-07-08 LAB — HEPATITIS PANEL, ACUTE
HCV Ab: NONREACTIVE
Hep A IgM: NONREACTIVE
Hep B C IgM: NONREACTIVE
Hepatitis B Surface Ag: NONREACTIVE

## 2020-07-08 LAB — CBC WITH DIFFERENTIAL/PLATELET
Abs Immature Granulocytes: 0.13 10*3/uL — ABNORMAL HIGH (ref 0.00–0.07)
Basophils Absolute: 0 10*3/uL (ref 0.0–0.1)
Basophils Relative: 0 %
Eosinophils Absolute: 0.2 10*3/uL (ref 0.0–0.5)
Eosinophils Relative: 4 %
HCT: 39.9 % (ref 36.0–46.0)
Hemoglobin: 12.6 g/dL (ref 12.0–15.0)
Immature Granulocytes: 2 %
Lymphocytes Relative: 29 %
Lymphs Abs: 1.7 10*3/uL (ref 0.7–4.0)
MCH: 27.2 pg (ref 26.0–34.0)
MCHC: 31.6 g/dL (ref 30.0–36.0)
MCV: 86.2 fL (ref 80.0–100.0)
Monocytes Absolute: 1 10*3/uL (ref 0.1–1.0)
Monocytes Relative: 17 %
Neutro Abs: 2.8 10*3/uL (ref 1.7–7.7)
Neutrophils Relative %: 48 %
Platelets: 351 10*3/uL (ref 150–400)
RBC: 4.63 MIL/uL (ref 3.87–5.11)
RDW: 14.8 % (ref 11.5–15.5)
WBC: 5.9 10*3/uL (ref 4.0–10.5)
nRBC: 0 % (ref 0.0–0.2)

## 2020-07-08 LAB — GLUCOSE, CAPILLARY
Glucose-Capillary: 107 mg/dL — ABNORMAL HIGH (ref 70–99)
Glucose-Capillary: 83 mg/dL (ref 70–99)
Glucose-Capillary: 98 mg/dL (ref 70–99)
Glucose-Capillary: 115 mg/dL — ABNORMAL HIGH (ref 70–99)

## 2020-07-08 LAB — C-REACTIVE PROTEIN: CRP: 1 mg/dL — ABNORMAL HIGH (ref <1.0)

## 2020-07-08 LAB — HEMOGLOBIN A1C
Hgb A1c MFr Bld: 6.5 % — ABNORMAL HIGH (ref 4.8–5.6)
Mean Plasma Glucose: 139.85 mg/dL

## 2020-07-08 LAB — MAGNESIUM: Magnesium: 2 mg/dL (ref 1.7–2.4)

## 2020-07-08 LAB — LIPID PANEL
Cholesterol: 259 mg/dL — ABNORMAL HIGH (ref 0–200)
HDL: 30 mg/dL — ABNORMAL LOW (ref >40)
LDL Cholesterol: 185 mg/dL — ABNORMAL HIGH (ref 0–99)
Total CHOL/HDL Ratio: 8.6 RATIO
Triglycerides: 218 mg/dL — ABNORMAL HIGH (ref <150)
VLDL: 44 mg/dL — ABNORMAL HIGH (ref 0–40)

## 2020-07-08 LAB — FERRITIN: Ferritin: 458 ng/mL — ABNORMAL HIGH (ref 11–307)

## 2020-07-08 LAB — PHOSPHORUS: Phosphorus: 3.1 mg/dL (ref 2.5–4.6)

## 2020-07-08 LAB — LACTATE DEHYDROGENASE: LDH: 222 U/L — ABNORMAL HIGH (ref 98–192)

## 2020-07-08 MED ORDER — EZETIMIBE 10 MG PO TABS
10.0000 mg | ORAL_TABLET | Freq: Every day | ORAL | Status: DC
  Administered 2020-07-08 – 2020-07-11 (×4): 10 mg via ORAL
  Filled 2020-07-08 (×4): qty 1

## 2020-07-08 NOTE — Plan of Care (Signed)
  Problem: Education: Goal: Knowledge of risk factors and measures for prevention of condition will improve Outcome: Progressing   Problem: Coping: Goal: Psychosocial and spiritual needs will be supported Outcome: Progressing   Problem: Respiratory: Goal: Will maintain a patent airway Outcome: Progressing   

## 2020-07-08 NOTE — Progress Notes (Signed)
PROGRESS NOTE    Phyllis Miller  GLO:[id number] DOB: 09/09/1961 DOA: 07/06/2020 PCP: Earnestine Leys, MD     Brief Narrative:  59 y.o. BF PMHx  CKD III (b/l Cr 1.4-1.6), HTN, HLD, DM type II, OSA/OHS,, MORBIDLY OBESE  Presents with concerns regarding COVID exposure and feeling ill.   Patient reports both daughters have tested positive for COVID and one of the is currently hospitalized for that reason She has felt ill for the past 14 days Daughter started coughing and feeling ill while they were together watching TV A few days later both her and another daughter who was present also started to feel ill First daughter is currently admitted to [facility] on 5th floor with COVID pneumonia Symptoms initially had cough, night sweats, aches in her legs, diarrhea, and poor appetite Currently just feels weak and fatigued, but denies shortness of breath, chest pain, fevers Neither patient or her daughters have had a COVID vaccine  In the ED her labs were notable for a positive COVID swab, AKI with Cr 3.74 but otherwise unremarkable CMP, unremarkable CBC, normal CXR, and unremarkable ECG. She was admitted for AKI and given 1L NS in the ED, no further treatment given for COVID given patient not hypoxic.     Subjective: 8/5 afebrile A/O x4, negative CP, positive headache, negative abdominal pain, negative nausea, negative vomiting.    Assessment & Plan: Covid vaccination; no Vaccine   Active Problems:   Essential hypertension   Hyperlipidemia   Diabetes mellitus type 2 in obese (HCC)   AKI (acute kidney injury) (HCC)   COVID-19 virus infection   Morbidly obese (HCC)   Obesity hypoventilation syndrome (HCC)   Obstructive sleep apnea   CKD (chronic kidney disease), stage III   Acute renal failure superimposed on stage 3a chronic kidney disease (HCC)  Covid infection COVID-19 Labs  Recent Labs    07/07/20 1143 07/08/20 0406  DDIMER 0.46  --   FERRITIN 493* 458*  LDH 252* 222*  CRP  1.7* 1.0*    Lab Results  Component Value Date   SARSCOV2NAA POSITIVE (A) 07/06/2020  -Currently patient on room air, and CRP minimally elevated.  Patient however was counseled that we must watch her closely as this could change rapidly at which point we would need to start high-dose steroids, remdesivir, and possibly Actemra dependent upon her Covid inflammatory marker, and symptoms. -Consult patient on the severity of current Covid pandemic and need to obtain vaccination, especially given her multiple medical conditions once she recovers.  OSA/OHS -CPAP per respiratory   Acute on CKD stage IIIa (baseline Cr 1.4-1.6) -Multifactorial Covid infection, dehydration. Recent Labs  Lab 07/06/20 1417 07/07/20 0424 07/08/20 0406  CREATININE 3.74* 3.36* 2.75*  -D5-0.9% saline 153ml/hr   DM type II controlled?  Uncontrolled? -8/5 Hemoglobin A1c= 6.5 -Sensitive SSI  HLD -LDL = 185  -Atorvastatin 80 mg daily -8/5 Zetia 10 mg daily  Essential HTN -Amlodipine 5 mg daily -Enalapril 10 mg daily -Metoprolol 50 mg BID -  Gout  Anxiety/depression -Wellbutrin 150 mg daily -Celexa 40 mg daily  Headache -Tylenol PRN      DVT prophylaxis: Subcu heparin Code Status: Full Family Communication:  Status is: Inpatient    Dispo: The patient is from: Home              Anticipated d/c is to: Home              Anticipated d/c date is: 24/10  Patient currently unstable      Consultants:    Procedures/Significant Events:    I have personally reviewed and interpreted all radiology studies and my findings are as above.  VENTILATOR SETTINGS:    Cultures 8/3 SARS Coronavirus positive   Antimicrobials: Anti-infectives (From admission, onward)   None       Devices    LINES / TUBES:      Continuous Infusions: . dextrose 5 % and 0.9% NaCl 100 mL/hr at 07/08/20 0750     Objective: Vitals:   07/07/20 1957 07/07/20 2318 07/08/20 0307 07/08/20  0757  BP: 123/70 122/79 (!) 124/56 132/74  Pulse: 81 93 86 82  Resp: 18 19 20 17   Temp: 97.6 F (36.4 C) (!) 97.5 F (36.4 C) 97.7 F (36.5 C) 98.1 F (36.7 C)  TempSrc: Oral Oral Axillary Oral  SpO2: 95% 98% 96% 94%  Weight:      Height:        Intake/Output Summary (Last 24 hours) at 07/08/2020 1151 Last data filed at 07/08/2020 [id number] Gross per 24 hour  Intake 573.69 ml  Output 250 ml  Net 323.69 ml   Filed Weights   07/07/20 0156 07/07/20 1216  Weight: (!) 138.5 kg (!) 138.4 kg    Examination:  General: A/O x4 No acute respiratory distress Eyes: negative scleral hemorrhage, negative anisocoria, negative icterus ENT: Negative Runny nose, negative gingival bleeding, Neck:  Negative scars, masses, torticollis, lymphadenopathy, JVD Lungs: Clear to auscultation bilaterally without wheezes or crackles Cardiovascular: Regular rate and rhythm without murmur gallop or rub normal S1 and S2 Abdomen: MORBIDLY OBESE abdominal pain, nondistended, positive soft, bowel sounds, no rebound, no ascites, no appreciable mass Extremities: No significant cyanosis, clubbing, or edema bilateral lower extremities Skin: Negative rashes, lesions, ulcers Psychiatric:  Negative depression, negative anxiety, negative fatigue, negative mania  Central nervous system:  Cranial nerves II through XII intact, tongue/uvula midline, all extremities muscle strength 5/5, sensation intact throughout, negative dysarthria, negative expressive aphasia, negative receptive aphasia.  .     Data Reviewed: Care during the described time interval was provided by me .  I have reviewed this patient's available data, including medical history, events of note, physical examination, and all test results as part of my evaluation.  CBC: Recent Labs  Lab 07/06/20 1417 07/07/20 0424 07/08/20 0406  WBC 6.5 5.7 5.9  NEUTROABS 3.6 2.7 2.8  HGB 14.0 13.3 12.6  HCT 44.8 41.7 39.9  MCV 86.2 84.8 86.2  PLT 348 308 351    Basic Metabolic Panel: Recent Labs  Lab 07/06/20 1417 07/07/20 0424 07/07/20 1143 07/08/20 0406  NA 141 141  --  145  K 4.5 4.1  --  4.7  CL 109 112*  --  116*  CO2 20* 20*  --  20*  GLUCOSE 103* 139*  --  130*  BUN 68* 58*  --  44*  CREATININE 3.74* 3.36*  --  2.75*  CALCIUM 8.8* 8.7*  --  8.7*  MG  --   --  2.3 2.0  PHOS  --   --  3.7 3.1   GFR: Estimated Creatinine Clearance: 31.8 mL/min (A) (by C-G formula based on SCr of 2.75 mg/dL (H)). Liver Function Tests: Recent Labs  Lab 07/06/20 1417 07/07/20 0424 07/08/20 0406  AST 40 40 34  ALT 35 38 38  ALKPHOS 68 60 57  BILITOT 0.6 0.7 0.5  PROT 7.0 6.4* 6.0*  ALBUMIN 2.5* 2.3* 2.2*   Recent Labs  Lab  07/06/20 1417  LIPASE 30   No results for input(s): AMMONIA in the last 168 hours. Coagulation Profile: No results for input(s): INR, PROTIME in the last 168 hours. Cardiac Enzymes: No results for input(s): CKTOTAL, CKMB, CKMBINDEX, TROPONINI in the last 168 hours. BNP (last 3 results) No results for input(s): PROBNP in the last 8760 hours. HbA1C: Recent Labs    07/08/20 0406  HGBA1C 6.5*   CBG: Recent Labs  Lab 07/07/20 1134 07/07/20 1628 07/07/20 2121 07/08/20 0737 07/08/20 1148  GLUCAP 150* 110* 134* 107* 83   Lipid Profile: Recent Labs    07/08/20 0406  CHOL 259*  HDL 30*  LDLCALC 185*  TRIG 218*  CHOLHDL 8.6   Thyroid Function Tests: No results for input(s): TSH, T4TOTAL, FREET4, T3FREE, THYROIDAB in the last 72 hours. Anemia Panel: Recent Labs    07/07/20 1143 07/08/20 0406  FERRITIN 493* 458*   Sepsis Labs: Recent Labs  Lab 07/06/20 1445  LATICACIDVEN 0.8    Recent Results (from the past 240 hour(s))  SARS Coronavirus 2 by RT PCR (hospital order, performed in [facility] hospital lab) Nasopharyngeal Nasopharyngeal Swab     Status: Abnormal   Collection Time: 07/06/20  2:18 PM   Specimen: Nasopharyngeal Swab  Result Value Ref Range Status   SARS Coronavirus 2 POSITIVE  (A) NEGATIVE Final    Comment: RESULT CALLED TO, READ BACK BY AND VERIFIED WITHDawna Part RN 1641 07/06/20 A BROWNING (NOTE) SARS-CoV-2 target nucleic acids are DETECTED  SARS-CoV-2 RNA is generally detectable in upper respiratory specimens  during the acute phase of infection.  Positive results are indicative  of the presence of the identified virus, but do not rule out bacterial infection or co-infection with other pathogens not detected by the test.  Clinical correlation with patient history and  other diagnostic information is necessary to determine patient infection status.  The expected result is negative.  Fact Sheet for Patients:   BoilerBrush.com.cy   Fact Sheet for Healthcare Providers:   https://pope.com/    This test is not yet approved or cleared by the [place] FDA and  has been authorized for detection and/or diagnosis of SARS-CoV-2 by FDA under an Emergency Use Authorization (EUA).  This EUA will remain in effect (meaning thi s test can be used) for the duration of  the COVID-19 declaration under Section 564(b)(1) of the Act, 21 U.S.C. section 360-bbb-3(b)(1), unless the authorization is terminated or revoked sooner.  Performed at [facility], [address], [place], [place] [address]          Radiology Studies: DG Chest Port 1 View  Result Date: 07/06/2020 CLINICAL DATA:  Cough. EXAM: PORTABLE CHEST 1 VIEW COMPARISON:  March 28, 2020. FINDINGS: Stable cardiomediastinal silhouette. No pneumothorax or pleural effusion is noted. Lungs are clear. Bony thorax is unremarkable. IMPRESSION: No active disease. Electronically Signed   By: Lupita Raider M.D.   On: 07/06/2020 14:43        Scheduled Meds: . amLODipine  5 mg Oral Daily  . aspirin EC  81 mg Oral Daily  . atorvastatin  80 mg Oral Daily  . buPROPion  150 mg Oral Daily  . citalopram  40 mg Oral Daily  . colchicine  0.3 mg Oral Daily  .  enalapril  10 mg Oral Daily  . heparin injection (subcutaneous)  5,000 Units Subcutaneous Q8H  . insulin aspart  0-5 Units Subcutaneous QHS  . insulin aspart  0-9 Units Subcutaneous TID  WC  . metoprolol tartrate  50 mg Oral BID  . pantoprazole  40 mg Oral Daily   Continuous Infusions: . dextrose 5 % and 0.9% NaCl 100 mL/hr at 07/08/20 0750     LOS: 1 day    Time spent:40 min    Alban Marucci, Roselind Messier, MD [facility] Pager [phone]  If 7PM-7AM, please contact night-coverage [email or web address] Password [email or web address] 07/08/2020, 11:51 AM

## 2020-07-08 NOTE — Progress Notes (Signed)
Pt states she does not want to wear CPAP tonight but instead wants 2L [place]. I told her if she changes her mind to call RT

## 2020-07-09 LAB — COMPREHENSIVE METABOLIC PANEL
ALT: 47 U/L — ABNORMAL HIGH (ref 0–44)
AST: 42 U/L — ABNORMAL HIGH (ref 15–41)
Albumin: 2.2 g/dL — ABNORMAL LOW (ref 3.5–5.0)
Alkaline Phosphatase: 63 U/L (ref 38–126)
Anion gap: 10 (ref 5–15)
BUN: 25 mg/dL — ABNORMAL HIGH (ref 6–20)
CO2: 21 mmol/L — ABNORMAL LOW (ref 22–32)
Calcium: 8.8 mg/dL — ABNORMAL LOW (ref 8.9–10.3)
Chloride: 114 mmol/L — ABNORMAL HIGH (ref 98–111)
Creatinine, Ser: 2.04 mg/dL — ABNORMAL HIGH (ref 0.44–1.00)
GFR calc Af Amer: 31 mL/min — ABNORMAL LOW (ref >60)
GFR calc non Af Amer: 27 mL/min — ABNORMAL LOW (ref >60)
Glucose, Bld: 101 mg/dL — ABNORMAL HIGH (ref 70–99)
Potassium: 5.1 mmol/L (ref 3.5–5.1)
Sodium: 145 mmol/L (ref 135–145)
Total Bilirubin: 0.3 mg/dL (ref 0.3–1.2)
Total Protein: 6.3 g/dL — ABNORMAL LOW (ref 6.5–8.1)

## 2020-07-09 LAB — MAGNESIUM: Magnesium: 1.7 mg/dL (ref 1.7–2.4)

## 2020-07-09 LAB — CBC WITH DIFFERENTIAL/PLATELET
Abs Immature Granulocytes: 0.13 10*3/uL — ABNORMAL HIGH (ref 0.00–0.07)
Basophils Absolute: 0 10*3/uL (ref 0.0–0.1)
Basophils Relative: 1 %
Eosinophils Absolute: 0.2 10*3/uL (ref 0.0–0.5)
Eosinophils Relative: 4 %
HCT: 41.8 % (ref 36.0–46.0)
Hemoglobin: 12.9 g/dL (ref 12.0–15.0)
Immature Granulocytes: 3 %
Lymphocytes Relative: 38 %
Lymphs Abs: 1.9 10*3/uL (ref 0.7–4.0)
MCH: 27 pg (ref 26.0–34.0)
MCHC: 30.9 g/dL (ref 30.0–36.0)
MCV: 87.6 fL (ref 80.0–100.0)
Monocytes Absolute: 0.6 10*3/uL (ref 0.1–1.0)
Monocytes Relative: 13 %
Neutro Abs: 2.1 10*3/uL (ref 1.7–7.7)
Neutrophils Relative %: 41 %
Platelets: 357 10*3/uL (ref 150–400)
RBC: 4.77 MIL/uL (ref 3.87–5.11)
RDW: 14.9 % (ref 11.5–15.5)
WBC: 4.9 10*3/uL (ref 4.0–10.5)
nRBC: 0 % (ref 0.0–0.2)

## 2020-07-09 LAB — FERRITIN: Ferritin: 539 ng/mL — ABNORMAL HIGH (ref 11–307)

## 2020-07-09 LAB — GLUCOSE, CAPILLARY
Glucose-Capillary: 96 mg/dL (ref 70–99)
Glucose-Capillary: 102 mg/dL — ABNORMAL HIGH (ref 70–99)
Glucose-Capillary: 125 mg/dL — ABNORMAL HIGH (ref 70–99)
Glucose-Capillary: 94 mg/dL (ref 70–99)

## 2020-07-09 LAB — C-REACTIVE PROTEIN: CRP: 1.3 mg/dL — ABNORMAL HIGH (ref <1.0)

## 2020-07-09 LAB — PHOSPHORUS: Phosphorus: 2.6 mg/dL (ref 2.5–4.6)

## 2020-07-09 LAB — LACTATE DEHYDROGENASE: LDH: 265 U/L — ABNORMAL HIGH (ref 98–192)

## 2020-07-09 MED ORDER — SUMATRIPTAN SUCCINATE 6 MG/0.5ML ~~LOC~~ SOLN
6.0000 mg | SUBCUTANEOUS | Status: AC
  Administered 2020-07-09: 6 mg via SUBCUTANEOUS
  Filled 2020-07-09: qty 0.5

## 2020-07-09 NOTE — Progress Notes (Signed)
PROGRESS NOTE    Phyllis Miller  ZOX:[id number] DOB: 11/06/1961 DOA: 07/06/2020 PCP: Earnestine Leys, MD     Brief Narrative:  59 y.o. BF PMHx  CKD III (b/l Cr 1.4-1.6), HTN, HLD, DM type II, OSA/OHS,, MORBIDLY OBESE  Presents with concerns regarding COVID exposure and feeling ill.   Patient reports both daughters have tested positive for COVID and one of the is currently hospitalized for that reason She has felt ill for the past 14 days Daughter started coughing and feeling ill while they were together watching TV A few days later both her and another daughter who was present also started to feel ill First daughter is currently admitted to [facility] on 5th floor with COVID pneumonia Symptoms initially had cough, night sweats, aches in her legs, diarrhea, and poor appetite Currently just feels weak and fatigued, but denies shortness of breath, chest pain, fevers Neither patient or her daughters have had a COVID vaccine  In the ED her labs were notable for a positive COVID swab, AKI with Cr 3.74 but otherwise unremarkable CMP, unremarkable CBC, normal CXR, and unremarkable ECG. She was admitted for AKI and given 1L NS in the ED, no further treatment given for COVID given patient not hypoxic.     Subjective: 8/6 A/O x4, negative CP, continues to have positive headache though somewhat reduced with Tylenol.  Negative abdominal pain, negative nausea, negative vomiting.    Assessment & Plan: Covid vaccination; no Vaccine   Active Problems:   Essential hypertension   Hyperlipidemia   Diabetes mellitus type 2 in obese (HCC)   AKI (acute kidney injury) (HCC)   COVID-19 virus infection   Morbidly obese (HCC)   Obesity hypoventilation syndrome (HCC)   Obstructive sleep apnea   CKD (chronic kidney disease), stage III   Acute renal failure superimposed on stage 3a chronic kidney disease (HCC)  Covid infection COVID-19 Labs  Recent Labs    07/07/20 1143 07/08/20 0406  DDIMER 0.46   --   FERRITIN 493* 458*  LDH 252* 222*  CRP 1.7* 1.0*    Lab Results  Component Value Date   SARSCOV2NAA POSITIVE (A) 07/06/2020  -Currently patient on room air, and CRP minimally elevated.  Patient however was counseled that we must watch her closely as this could change rapidly at which point we would need to start high-dose steroids, remdesivir, and possibly Actemra dependent upon her Covid inflammatory marker, and symptoms. -Consult patient on the severity of current Covid pandemic and need to obtain vaccination, especially given her multiple medical conditions once she recovers.  OSA/OHS -CPAP per respiratory   Acute on CKD stage IIIa (baseline Cr 1.4-1.6) -Multifactorial Covid infection, dehydration. Recent Labs  Lab 07/06/20 1417 07/07/20 0424 07/08/20 0406 07/09/20 1146  CREATININE 3.74* 3.36* 2.75* 2.04*  -D5-0.9% saline 165ml/hr -Continues to improve, will probably be ready for discharge in 48 hours  DM type II controlled?  Uncontrolled? -8/5 Hemoglobin A1c= 6.5 -Sensitive SSI  HLD -LDL = 185  -Atorvastatin 80 mg daily -8/5 Zetia 10 mg daily  Essential HTN -Amlodipine 5 mg daily -Enalapril 10 mg daily -Metoprolol 50 mg BID -  Gout  Anxiety/depression -Wellbutrin 150 mg daily -Celexa 40 mg daily  Headache -Tylenol PRN -Sumatriptan 6 mg IM x1     DVT prophylaxis: Subcu heparin Code Status: Full Family Communication:  Status is: Inpatient    Dispo: The patient is from: Home              Anticipated d/c is  to: Home              Anticipated d/c date is: 8/10              Patient currently unstable      Consultants:    Procedures/Significant Events:    I have personally reviewed and interpreted all radiology studies and my findings are as above.  VENTILATOR SETTINGS:    Cultures 8/3 SARS Coronavirus positive   Antimicrobials: Anti-infectives (From admission, onward)   None       Devices    LINES / TUBES:       Continuous Infusions: . dextrose 5 % and 0.9% NaCl 100 mL/hr at 07/09/20 [id number]     Objective: Vitals:   07/08/20 2010 07/09/20 0009 07/09/20 0411 07/09/20 0742  BP: 130/71 130/85 137/70 129/77  Pulse: 79 79 82 79  Resp: 20 20 18 18   Temp: 97.8 F (36.6 C) 97.9 F (36.6 C) 97.9 F (36.6 C) 98 F (36.7 C)  TempSrc: Oral Oral Oral Oral  SpO2: 93% 97% 96% 96%  Weight:      Height:        Intake/Output Summary (Last 24 hours) at 07/09/2020 1101 Last data filed at 07/09/2020 1022 Gross per 24 hour  Intake 4603.86 ml  Output 300 ml  Net 4303.86 ml   Filed Weights   07/07/20 0156 07/07/20 1216  Weight: (!) 138.5 kg (!) 138.4 kg    Examination:  General: A/O x4 No acute respiratory distress Eyes: negative scleral hemorrhage, negative anisocoria, negative icterus ENT: Negative Runny nose, negative gingival bleeding, Neck:  Negative scars, masses, torticollis, lymphadenopathy, JVD Lungs: Clear to auscultation bilaterally without wheezes or crackles Cardiovascular: Regular rate and rhythm without murmur gallop or rub normal S1 and S2 Abdomen: MORBIDLY OBESE abdominal pain, nondistended, positive soft, bowel sounds, no rebound, no ascites, no appreciable mass Extremities: No significant cyanosis, clubbing, or edema bilateral lower extremities Skin: Negative rashes, lesions, ulcers Psychiatric:  Negative depression, negative anxiety, negative fatigue, negative mania  Central nervous system:  Cranial nerves II through XII intact, tongue/uvula midline, all extremities muscle strength 5/5, sensation intact throughout, negative dysarthria, negative expressive aphasia, negative receptive aphasia.  .     Data Reviewed: Care during the described time interval was provided by me .  I have reviewed this patient's available data, including medical history, events of note, physical examination, and all test results as part of my evaluation.  CBC: Recent Labs  Lab 07/06/20 1417  07/07/20 0424 07/08/20 0406  WBC 6.5 5.7 5.9  NEUTROABS 3.6 2.7 2.8  HGB 14.0 13.3 12.6  HCT 44.8 41.7 39.9  MCV 86.2 84.8 86.2  PLT 348 308 351   Basic Metabolic Panel: Recent Labs  Lab 07/06/20 1417 07/07/20 0424 07/07/20 1143 07/08/20 0406  NA 141 141  --  145  K 4.5 4.1  --  4.7  CL 109 112*  --  116*  CO2 20* 20*  --  20*  GLUCOSE 103* 139*  --  130*  BUN 68* 58*  --  44*  CREATININE 3.74* 3.36*  --  2.75*  CALCIUM 8.8* 8.7*  --  8.7*  MG  --   --  2.3 2.0  PHOS  --   --  3.7 3.1   GFR: Estimated Creatinine Clearance: 31.8 mL/min (A) (by C-G formula based on SCr of 2.75 mg/dL (H)). Liver Function Tests: Recent Labs  Lab 07/06/20 1417 07/07/20 0424 07/08/20 0406  AST 40 40 34  ALT 35 38 38  ALKPHOS 68 60 57  BILITOT 0.6 0.7 0.5  PROT 7.0 6.4* 6.0*  ALBUMIN 2.5* 2.3* 2.2*   Recent Labs  Lab 07/06/20 1417  LIPASE 30   No results for input(s): AMMONIA in the last 168 hours. Coagulation Profile: No results for input(s): INR, PROTIME in the last 168 hours. Cardiac Enzymes: No results for input(s): CKTOTAL, CKMB, CKMBINDEX, TROPONINI in the last 168 hours. BNP (last 3 results) No results for input(s): PROBNP in the last 8760 hours. HbA1C: Recent Labs    07/08/20 0406  HGBA1C 6.5*   CBG: Recent Labs  Lab 07/08/20 0737 07/08/20 1148 07/08/20 1558 07/08/20 2106 07/09/20 0740  GLUCAP 107* 83 98 115* 96   Lipid Profile: Recent Labs    07/08/20 0406  CHOL 259*  HDL 30*  LDLCALC 185*  TRIG 218*  CHOLHDL 8.6   Thyroid Function Tests: No results for input(s): TSH, T4TOTAL, FREET4, T3FREE, THYROIDAB in the last 72 hours. Anemia Panel: Recent Labs    07/07/20 1143 07/08/20 0406  FERRITIN 493* 458*   Sepsis Labs: Recent Labs  Lab 07/06/20 1445  LATICACIDVEN 0.8    Recent Results (from the past 240 hour(s))  SARS Coronavirus 2 by RT PCR (hospital order, performed in [facility] hospital lab) Nasopharyngeal Nasopharyngeal Swab      Status: Abnormal   Collection Time: 07/06/20  2:18 PM   Specimen: Nasopharyngeal Swab  Result Value Ref Range Status   SARS Coronavirus 2 POSITIVE (A) NEGATIVE Final    Comment: RESULT CALLED TO, READ BACK BY AND VERIFIED WITHDawna Part RN 1641 07/06/20 A BROWNING (NOTE) SARS-CoV-2 target nucleic acids are DETECTED  SARS-CoV-2 RNA is generally detectable in upper respiratory specimens  during the acute phase of infection.  Positive results are indicative  of the presence of the identified virus, but do not rule out bacterial infection or co-infection with other pathogens not detected by the test.  Clinical correlation with patient history and  other diagnostic information is necessary to determine patient infection status.  The expected result is negative.  Fact Sheet for Patients:   BoilerBrush.com.cy   Fact Sheet for Healthcare Providers:   https://pope.com/    This test is not yet approved or cleared by the [place] FDA and  has been authorized for detection and/or diagnosis of SARS-CoV-2 by FDA under an Emergency Use Authorization (EUA).  This EUA will remain in effect (meaning thi s test can be used) for the duration of  the COVID-19 declaration under Section 564(b)(1) of the Act, 21 U.S.C. section 360-bbb-3(b)(1), unless the authorization is terminated or revoked sooner.  Performed at [facility], [address] [place], [place] [address]          Radiology Studies: No results found.      Scheduled Meds: . amLODipine  5 mg Oral Daily  . aspirin EC  81 mg Oral Daily  . atorvastatin  80 mg Oral Daily  . buPROPion  150 mg Oral Daily  . citalopram  40 mg Oral Daily  . colchicine  0.3 mg Oral Daily  . enalapril  10 mg Oral Daily  . ezetimibe  10 mg Oral Daily  . heparin injection (subcutaneous)  5,000 Units Subcutaneous Q8H  . insulin aspart  0-5 Units Subcutaneous QHS  . insulin aspart  0-9  Units Subcutaneous TID WC  . metoprolol tartrate  50 mg Oral BID  . pantoprazole  40 mg Oral Daily   Continuous  Infusions: . dextrose 5 % and 0.9% NaCl 100 mL/hr at 07/09/20 0620     LOS: 2 days    Time spent:40 min    Natali Lavallee, Roselind Messier, MD [facility] Pager [phone]  If 7PM-7AM, please contact night-coverage [email or web address] Password [email or web address] 07/09/2020, 11:01 AM

## 2020-07-09 NOTE — Progress Notes (Signed)
Pt refused lab draw; will wait for next phlebotomist.

## 2020-07-09 NOTE — Progress Notes (Signed)
Patient refused use of cpap for the evening.  

## 2020-07-09 NOTE — Plan of Care (Signed)
  Problem: Respiratory: Goal: Will maintain a patent airway Outcome: Progressing Goal: Complications related to the disease process, condition or treatment will be avoided or minimized Outcome: Progressing   

## 2020-07-09 NOTE — Progress Notes (Signed)
Attempted to place IV x2 and was unsuccessful.  IV Team consult placed.

## 2020-07-10 LAB — CBC WITH DIFFERENTIAL/PLATELET
Abs Immature Granulocytes: 0.11 10*3/uL — ABNORMAL HIGH (ref 0.00–0.07)
Basophils Absolute: 0 10*3/uL (ref 0.0–0.1)
Basophils Relative: 0 %
Eosinophils Absolute: 0.2 10*3/uL (ref 0.0–0.5)
Eosinophils Relative: 5 %
HCT: 40.5 % (ref 36.0–46.0)
Hemoglobin: 12.5 g/dL (ref 12.0–15.0)
Immature Granulocytes: 2 %
Lymphocytes Relative: 38 %
Lymphs Abs: 2 10*3/uL (ref 0.7–4.0)
MCH: 27 pg (ref 26.0–34.0)
MCHC: 30.9 g/dL (ref 30.0–36.0)
MCV: 87.5 fL (ref 80.0–100.0)
Monocytes Absolute: 0.8 10*3/uL (ref 0.1–1.0)
Monocytes Relative: 15 %
Neutro Abs: 2.1 10*3/uL (ref 1.7–7.7)
Neutrophils Relative %: 40 %
Platelets: 332 10*3/uL (ref 150–400)
RBC: 4.63 MIL/uL (ref 3.87–5.11)
RDW: 15 % (ref 11.5–15.5)
WBC: 5.2 10*3/uL (ref 4.0–10.5)
nRBC: 0 % (ref 0.0–0.2)

## 2020-07-10 LAB — COMPREHENSIVE METABOLIC PANEL
ALT: 52 U/L — ABNORMAL HIGH (ref 0–44)
AST: 42 U/L — ABNORMAL HIGH (ref 15–41)
Albumin: 1.7 g/dL — ABNORMAL LOW (ref 3.5–5.0)
Alkaline Phosphatase: 57 U/L (ref 38–126)
Anion gap: 8 (ref 5–15)
BUN: 18 mg/dL (ref 6–20)
CO2: 19 mmol/L — ABNORMAL LOW (ref 22–32)
Calcium: 8.7 mg/dL — ABNORMAL LOW (ref 8.9–10.3)
Chloride: 115 mmol/L — ABNORMAL HIGH (ref 98–111)
Creatinine, Ser: 1.79 mg/dL — ABNORMAL HIGH (ref 0.44–1.00)
GFR calc Af Amer: 36 mL/min — ABNORMAL LOW (ref >60)
GFR calc non Af Amer: 31 mL/min — ABNORMAL LOW (ref >60)
Glucose, Bld: 119 mg/dL — ABNORMAL HIGH (ref 70–99)
Potassium: 5.1 mmol/L (ref 3.5–5.1)
Sodium: 142 mmol/L (ref 135–145)
Total Bilirubin: 0.6 mg/dL (ref 0.3–1.2)
Total Protein: 5.7 g/dL — ABNORMAL LOW (ref 6.5–8.1)

## 2020-07-10 LAB — GLUCOSE, CAPILLARY
Glucose-Capillary: 107 mg/dL — ABNORMAL HIGH (ref 70–99)
Glucose-Capillary: 104 mg/dL — ABNORMAL HIGH (ref 70–99)
Glucose-Capillary: 109 mg/dL — ABNORMAL HIGH (ref 70–99)
Glucose-Capillary: 92 mg/dL (ref 70–99)

## 2020-07-10 LAB — MAGNESIUM: Magnesium: 1.6 mg/dL — ABNORMAL LOW (ref 1.7–2.4)

## 2020-07-10 LAB — FERRITIN: Ferritin: 440 ng/mL — ABNORMAL HIGH (ref 11–307)

## 2020-07-10 LAB — PHOSPHORUS: Phosphorus: 2.9 mg/dL (ref 2.5–4.6)

## 2020-07-10 LAB — C-REACTIVE PROTEIN: CRP: 0.5 mg/dL (ref <1.0)

## 2020-07-10 LAB — LACTATE DEHYDROGENASE: LDH: 248 U/L — ABNORMAL HIGH (ref 98–192)

## 2020-07-10 MED ORDER — MAGNESIUM SULFATE 50 % IJ SOLN
3.0000 g | Freq: Once | INTRAVENOUS | Status: AC
  Administered 2020-07-10: 3 g via INTRAVENOUS
  Filled 2020-07-10 (×2): qty 6

## 2020-07-10 MED ORDER — SODIUM ZIRCONIUM CYCLOSILICATE 10 G PO PACK
10.0000 g | PACK | Freq: Two times a day (BID) | ORAL | Status: AC
  Administered 2020-07-10 (×2): 10 g via ORAL
  Filled 2020-07-10 (×2): qty 1

## 2020-07-10 NOTE — Progress Notes (Signed)
PROGRESS NOTE    Phyllis Miller  RJJ:[id number] DOB: 02-25-1961 DOA: 07/06/2020 PCP: Earnestine Leys, MD     Brief Narrative:  59 y.o. BF PMHx  CKD III (b/l Cr 1.4-1.6), HTN, HLD, DM type II, OSA/OHS,, MORBIDLY OBESE  Presents with concerns regarding COVID exposure and feeling ill.   Patient reports both daughters have tested positive for COVID and one of the is currently hospitalized for that reason She has felt ill for the past 14 days Daughter started coughing and feeling ill while they were together watching TV A few days later both her and another daughter who was present also started to feel ill First daughter is currently admitted to [facility] on 5th floor with COVID pneumonia Symptoms initially had cough, night sweats, aches in her legs, diarrhea, and poor appetite Currently just feels weak and fatigued, but denies shortness of breath, chest pain, fevers Neither patient or her daughters have had a COVID vaccine  In the ED her labs were notable for a positive COVID swab, AKI with Cr 3.74 but otherwise unremarkable CMP, unremarkable CBC, normal CXR, and unremarkable ECG. She was admitted for AKI and given 1L NS in the ED, no further treatment given for COVID given patient not hypoxic.     Subjective: 8/7 A/O x4, negative CP, negative abdominal pain.  Headache has resolved.   Assessment & Plan: Covid vaccination; no Vaccine   Active Problems:   Essential hypertension   Hyperlipidemia   Diabetes mellitus type 2 in obese (HCC)   AKI (acute kidney injury) (HCC)   COVID-19 virus infection   Morbidly obese (HCC)   Obesity hypoventilation syndrome (HCC)   Obstructive sleep apnea   CKD (chronic kidney disease), stage III   Acute renal failure superimposed on stage 3a chronic kidney disease (HCC)  Covid infection COVID-19 Labs  Recent Labs    07/08/20 0406 07/09/20 1146 07/10/20 0736  FERRITIN 458* 539* 440*  LDH 222* 265* 248*  CRP 1.0* 1.3* 0.5    Lab Results    Component Value Date   SARSCOV2NAA POSITIVE (A) 07/06/2020  -Currently patient on room air, and CRP minimally elevated.  Patient however was counseled that we must watch her closely as this could change rapidly at which point we would need to start high-dose steroids, remdesivir, and possibly Actemra dependent upon her Covid inflammatory marker, and symptoms. -Consult patient on the severity of current Covid pandemic and need to obtain vaccination, especially given her multiple medical conditions once she recovers.  OSA/OHS -CPAP per respiratory  Acute on CKD stage IIIa (baseline Cr 1.4-1.6) -Multifactorial Covid infection, dehydration. Recent Labs  Lab 07/06/20 1417 07/07/20 0424 07/08/20 0406 07/09/20 1146 07/10/20 0736  CREATININE 3.74* 3.36* 2.75* 2.04* 1.79*  -D5-0.9% saline 156ml/hr -8/7 consult patient should be able to discharge her in the A.m.   DM type II controlled?  Uncontrolled? -8/5 Hemoglobin A1c= 6.5 -Sensitive SSI  HLD -LDL = 185  -Atorvastatin 80 mg daily -8/5 Zetia 10 mg daily  Essential HTN -Amlodipine 5 mg daily -Enalapril 10 mg daily -Metoprolol 50 mg BID -  Gout  Anxiety/depression -Wellbutrin 150 mg daily -Celexa 40 mg daily  Headache -Tylenol PRN -Sumatriptan 6 mg IM x1  Hypomagnesmia -Magnesium goal> 2 -Magnesium IV 3 g  Hyperkalemia -Lokelma 10 g x 2 dose   DVT prophylaxis: Subcu heparin Code Status: Full Family Communication:  Status is: Inpatient    Dispo: The patient is from: Home  Anticipated d/c is to: Home              Anticipated d/c date is: 8/10              Patient currently unstable      Consultants:    Procedures/Significant Events:    I have personally reviewed and interpreted all radiology studies and my findings are as above.  VENTILATOR SETTINGS:    Cultures 8/3 SARS Coronavirus positive   Antimicrobials: Anti-infectives (From admission, onward)   None        Devices    LINES / TUBES:      Continuous Infusions: . dextrose 5 % and 0.9% NaCl 100 mL/hr at 07/10/20 0807     Objective: Vitals:   07/09/20 2021 07/10/20 0003 07/10/20 0600 07/10/20 0818  BP: 125/62 (!) 118/58 (!) 123/57 123/72  Pulse:  77 82 78  Resp: 18 20 16    Temp: 98 F (36.7 C) 98.4 F (36.9 C) 97.6 F (36.4 C) 97.6 F (36.4 C)  TempSrc: Oral Oral Oral Oral  SpO2:  95% 99% 95%  Weight:      Height:        Intake/Output Summary (Last 24 hours) at 07/10/2020 1259 Last data filed at 07/10/2020 09/09/2020 Gross per 24 hour  Intake 2647.03 ml  Output 750 ml  Net 1897.03 ml   Filed Weights   07/07/20 0156 07/07/20 1216  Weight: (!) 138.5 kg (!) 138.4 kg    Examination:  General: A/O x4 No acute respiratory distress Eyes: negative scleral hemorrhage, negative anisocoria, negative icterus ENT: Negative Runny nose, negative gingival bleeding, Neck:  Negative scars, masses, torticollis, lymphadenopathy, JVD Lungs: Clear to auscultation bilaterally without wheezes or crackles Cardiovascular: Regular rate and rhythm without murmur gallop or rub normal S1 and S2 Abdomen: MORBIDLY OBESE abdominal pain, nondistended, positive soft, bowel sounds, no rebound, no ascites, no appreciable mass Extremities: No significant cyanosis, clubbing, or edema bilateral lower extremities Skin: Negative rashes, lesions, ulcers Psychiatric:  Negative depression, negative anxiety, negative fatigue, negative mania  Central nervous system:  Cranial nerves II through XII intact, tongue/uvula midline, all extremities muscle strength 5/5, sensation intact throughout, negative dysarthria, negative expressive aphasia, negative receptive aphasia.  .     Data Reviewed: Care during the described time interval was provided by me .  I have reviewed this patient's available data, including medical history, events of note, physical examination, and all test results as part of my  evaluation.  CBC: Recent Labs  Lab 07/06/20 1417 07/07/20 0424 07/08/20 0406 07/09/20 1146 07/10/20 0736  WBC 6.5 5.7 5.9 4.9 5.2  NEUTROABS 3.6 2.7 2.8 2.1 2.1  HGB 14.0 13.3 12.6 12.9 12.5  HCT 44.8 41.7 39.9 41.8 40.5  MCV 86.2 84.8 86.2 87.6 87.5  PLT 348 308 351 357 332   Basic Metabolic Panel: Recent Labs  Lab 07/06/20 1417 07/07/20 0424 07/07/20 1143 07/08/20 0406 07/09/20 1146 07/10/20 0736  NA 141 141  --  145 145 142  K 4.5 4.1  --  4.7 5.1 5.1  CL 109 112*  --  116* 114* 115*  CO2 20* 20*  --  20* 21* 19*  GLUCOSE 103* 139*  --  130* 101* 119*  BUN 68* 58*  --  44* 25* 18  CREATININE 3.74* 3.36*  --  2.75* 2.04* 1.79*  CALCIUM 8.8* 8.7*  --  8.7* 8.8* 8.7*  MG  --   --  2.3 2.0 1.7 1.6*  PHOS  --   --  3.7 3.1 2.6 2.9   GFR: Estimated Creatinine Clearance: 48.9 mL/min (A) (by C-G formula based on SCr of 1.79 mg/dL (H)). Liver Function Tests: Recent Labs  Lab 07/06/20 1417 07/07/20 0424 07/08/20 0406 07/09/20 1146 07/10/20 0736  AST 40 40 34 42* 42*  ALT 35 38 38 47* 52*  ALKPHOS 68 60 57 63 57  BILITOT 0.6 0.7 0.5 0.3 0.6  PROT 7.0 6.4* 6.0* 6.3* 5.7*  ALBUMIN 2.5* 2.3* 2.2* 2.2* 1.7*   Recent Labs  Lab 07/06/20 1417  LIPASE 30   No results for input(s): AMMONIA in the last 168 hours. Coagulation Profile: No results for input(s): INR, PROTIME in the last 168 hours. Cardiac Enzymes: No results for input(s): CKTOTAL, CKMB, CKMBINDEX, TROPONINI in the last 168 hours. BNP (last 3 results) No results for input(s): PROBNP in the last 8760 hours. HbA1C: Recent Labs    07/08/20 0406  HGBA1C 6.5*   CBG: Recent Labs  Lab 07/09/20 1152 07/09/20 1622 07/09/20 2204 07/10/20 0817 07/10/20 1158  GLUCAP 102* 125* 94 107* 104*   Lipid Profile: Recent Labs    07/08/20 0406  CHOL 259*  HDL 30*  LDLCALC 185*  TRIG 218*  CHOLHDL 8.6   Thyroid Function Tests: No results for input(s): TSH, T4TOTAL, FREET4, T3FREE, THYROIDAB in the last  72 hours. Anemia Panel: Recent Labs    07/09/20 1146 07/10/20 0736  FERRITIN 539* 440*   Sepsis Labs: Recent Labs  Lab 07/06/20 1445  LATICACIDVEN 0.8    Recent Results (from the past 240 hour(s))  SARS Coronavirus 2 by RT PCR (hospital order, [facility]) Nasopharyngeal Nasopharyngeal Swab     Status: Abnormal   Collection Time: 07/06/20  2:18 PM   Specimen: Nasopharyngeal Swab  Result Value Ref Range Status   SARS Coronavirus 2 POSITIVE (A) NEGATIVE Final    Comment: RESULT CALLED TO, READ BACK BY AND VERIFIED WITHDawna Part: T SHROPSHIRE RN [id number] 07/06/20 A BROWNING (NOTE) SARS-CoV-2 target nucleic acids are DETECTED  SARS-CoV-2 RNA is generally detectable in upper respiratory specimens  during the acute phase of infection.  Positive results are indicative  of the presence of the identified virus, but do not rule out bacterial infection or co-infection with other pathogens not detected by the test.  Clinical correlation with patient history and  other diagnostic information is necessary to determine patient infection status.  The expected result is negative.  Fact Sheet for Patients:   BoilerBrush.com.cyhttps://www.fda.gov/media/136312/download   Fact Sheet for Healthcare Providers:   https://pope.com/https://www.fda.gov/media/136313/download    This test is not yet approved or cleared by the [place] FDA and  has been authorized for detection and/or diagnosis of SARS-CoV-2 by FDA under an Emergency Use Authorization (EUA).  This EUA will remain in effect (meaning thi s test can be used) for the duration of  the COVID-19 declaration under Section 564(b)(1) of the Act, 21 U.S.C. section 360-bbb-3(b)(1), unless the authorization is terminated or revoked sooner.  Performed at [facility], [address] [place], [place] [address]          Radiology Studies: No results found.      Scheduled Meds: . amLODipine  5 mg Oral Daily  . aspirin EC  81 mg Oral Daily  .  atorvastatin  80 mg Oral Daily  . buPROPion  150 mg Oral Daily  . citalopram  40 mg Oral Daily  . colchicine  0.3 mg Oral Daily  . enalapril  10 mg Oral  Daily  . ezetimibe  10 mg Oral Daily  . heparin injection (subcutaneous)  5,000 Units Subcutaneous Q8H  . insulin aspart  0-5 Units Subcutaneous QHS  . insulin aspart  0-9 Units Subcutaneous TID WC  . metoprolol tartrate  50 mg Oral BID  . pantoprazole  40 mg Oral Daily   Continuous Infusions: . dextrose 5 % and 0.9% NaCl 100 mL/hr at 07/10/20 0807     LOS: 3 days    Time spent:40 min    Gudelia Eugene, Roselind Messier, MD [facility] Pager [phone]  If 7PM-7AM, please contact night-coverage [email or web address] Password [facility] 07/10/2020, 12:59 PM

## 2020-07-11 LAB — MAGNESIUM: Magnesium: 1.8 mg/dL (ref 1.7–2.4)

## 2020-07-11 LAB — CBC WITH DIFFERENTIAL/PLATELET
Abs Immature Granulocytes: 0.11 10*3/uL — ABNORMAL HIGH (ref 0.00–0.07)
Basophils Absolute: 0 10*3/uL (ref 0.0–0.1)
Basophils Relative: 0 %
Eosinophils Absolute: 0.2 10*3/uL (ref 0.0–0.5)
Eosinophils Relative: 4 %
HCT: 40.4 % (ref 36.0–46.0)
Hemoglobin: 12.6 g/dL (ref 12.0–15.0)
Immature Granulocytes: 2 %
Lymphocytes Relative: 36 %
Lymphs Abs: 1.9 10*3/uL (ref 0.7–4.0)
MCH: 27.3 pg (ref 26.0–34.0)
MCHC: 31.2 g/dL (ref 30.0–36.0)
MCV: 87.6 fL (ref 80.0–100.0)
Monocytes Absolute: 0.7 10*3/uL (ref 0.1–1.0)
Monocytes Relative: 12 %
Neutro Abs: 2.5 10*3/uL (ref 1.7–7.7)
Neutrophils Relative %: 46 %
Platelets: 341 10*3/uL (ref 150–400)
RBC: 4.61 MIL/uL (ref 3.87–5.11)
RDW: 14.8 % (ref 11.5–15.5)
WBC: 5.4 10*3/uL (ref 4.0–10.5)
nRBC: 0 % (ref 0.0–0.2)

## 2020-07-11 LAB — C-REACTIVE PROTEIN: CRP: <0.5 mg/dL (ref <1.0)

## 2020-07-11 LAB — LACTATE DEHYDROGENASE: LDH: 239 U/L — ABNORMAL HIGH (ref 98–192)

## 2020-07-11 LAB — COMPREHENSIVE METABOLIC PANEL
ALT: 63 U/L — ABNORMAL HIGH (ref 0–44)
AST: 46 U/L — ABNORMAL HIGH (ref 15–41)
Albumin: 2.1 g/dL — ABNORMAL LOW (ref 3.5–5.0)
Alkaline Phosphatase: 64 U/L (ref 38–126)
Anion gap: 10 (ref 5–15)
BUN: 19 mg/dL (ref 6–20)
CO2: 22 mmol/L (ref 22–32)
Calcium: 9 mg/dL (ref 8.9–10.3)
Chloride: 112 mmol/L — ABNORMAL HIGH (ref 98–111)
Creatinine, Ser: 1.92 mg/dL — ABNORMAL HIGH (ref 0.44–1.00)
GFR calc Af Amer: 33 mL/min — ABNORMAL LOW (ref >60)
GFR calc non Af Amer: 29 mL/min — ABNORMAL LOW (ref >60)
Glucose, Bld: 107 mg/dL — ABNORMAL HIGH (ref 70–99)
Potassium: 5.1 mmol/L (ref 3.5–5.1)
Sodium: 144 mmol/L (ref 135–145)
Total Bilirubin: 0.4 mg/dL (ref 0.3–1.2)
Total Protein: 5.9 g/dL — ABNORMAL LOW (ref 6.5–8.1)

## 2020-07-11 LAB — GLUCOSE, CAPILLARY: Glucose-Capillary: 86 mg/dL (ref 70–99)

## 2020-07-11 LAB — PHOSPHORUS: Phosphorus: 4 mg/dL (ref 2.5–4.6)

## 2020-07-11 LAB — FERRITIN: Ferritin: 473 ng/mL — ABNORMAL HIGH (ref 11–307)

## 2020-07-11 MED ORDER — EZETIMIBE 10 MG PO TABS: 10.0000 mg | ORAL_TABLET | Freq: Every day | ORAL | 0 refills | Status: DC

## 2020-07-11 MED ORDER — BUPROPION HCL ER (XL) 150 MG PO TB24: 150.0000 mg | ORAL_TABLET | Freq: Every day | ORAL | 0 refills | Status: AC

## 2020-07-11 MED ORDER — TRAZODONE HCL 50 MG PO TABS: 25.0000 mg | ORAL_TABLET | Freq: Every evening | ORAL | 0 refills | Status: AC | PRN

## 2020-07-11 MED ORDER — METOPROLOL TARTRATE 50 MG PO TABS: 50.0000 mg | ORAL_TABLET | Freq: Two times a day (BID) | ORAL | 0 refills | Status: AC

## 2020-07-11 NOTE — Progress Notes (Signed)
Nsg Discharge Note  Admit Date:  07/06/2020 Discharge date: 07/11/2020   Phyllis Miller to be D/C'd Home per MD order.  AVS completed.    Discharge Medication: Allergies as of 07/11/2020      Reactions   Dust Mite Mixed Allergen Ext [mite (d. Farinae)]       Medication List    STOP taking these medications   hydrOXYzine 25 MG tablet Commonly known as: ATARAX/VISTARIL   methimazole 5 MG tablet Commonly known as: TAPAZOLE     TAKE these medications   amLODipine 5 MG tablet Commonly known as: NORVASC Take 5 mg by mouth daily.   aspirin EC 81 MG tablet Take 81 mg by mouth daily.   atorvastatin 80 MG tablet Commonly known as: LIPITOR Take 80 mg by mouth daily.   buPROPion 150 MG 24 hr tablet Commonly known as: WELLBUTRIN XL Take 1 tablet (150 mg total) by mouth daily. Start taking on: July 12, 2020 What changed:   medication strength  how much to take   citalopram 40 MG tablet Commonly known as: CELEXA Take 40 mg by mouth daily.   Colchicine 0.6 MG Caps Take 0.6 mg by mouth daily.   enalapril 10 MG tablet Commonly known as: VASOTEC Take 10 mg by mouth daily.   ezetimibe 10 MG tablet Commonly known as: ZETIA Take 1 tablet (10 mg total) by mouth daily. Start taking on: July 12, 2020   famotidine 20 MG tablet Commonly known as: PEPCID Take 20 mg by mouth daily.   metoprolol tartrate 50 MG tablet Commonly known as: LOPRESSOR Take 1 tablet (50 mg total) by mouth 2 (two) times daily. What changed:   medication strength  how much to take   omeprazole 20 MG capsule Commonly known as: PRILOSEC Take 1 capsule (20 mg total) by mouth 2 (two) times daily before a meal. What changed: when to take this   polyethylene glycol 17 g packet Commonly known as: MIRALAX / GLYCOLAX Take 17 g by mouth daily as needed for moderate constipation.   ProAir HFA 108 (90 Base) MCG/ACT inhaler Generic drug: albuterol Inhale 2 puffs into the lungs every 6 (six) hours as  needed for wheezing or shortness of breath.   traZODone 50 MG tablet Commonly known as: DESYREL Take 0.5 tablets (25 mg total) by mouth at bedtime as needed for sleep.       Discharge Assessment: Vitals:   07/11/20 0027 07/11/20 0346  BP: (!) 110/52 (!) 142/77  Pulse: 81 82  Resp: 18 16  Temp: 98.2 F (36.8 C) 97.7 F (36.5 C)  SpO2: 100% 100%   Skin clean, dry and intact without evidence of skin break down, no evidence of skin tears noted. IV catheter discontinued intact. Site without signs and symptoms of complications - no redness or edema noted at insertion site, patient denies c/o pain - only slight tenderness at site.  Dressing with slight pressure applied.  D/c Instructions-Education: Discharge instructions given to patient/family with verbalized understanding. D/c education completed with patient/family including follow up instructions, medication list, d/c activities limitations if indicated, with other d/c instructions as indicated by MD - patient able to verbalize understanding, all questions fully answered. Patient instructed to return to ED, call 911, or call MD for any changes in condition.  Patient educated about being infectious for 3 weeks following discharge. Patient instructed to quarantine and to remain social distancing/mask wearing. Patient verbalized understanding. Patient stated Dr. Charlaine Dalton has retired but she has a new  PCP and understood she must follow up with him in 3 weeks following hospital discharge.  Patient escorted via WC, and D/C home via private auto.  Lyndal Pulley, RN 07/11/2020 9:41 AM

## 2020-07-11 NOTE — Discharge Summary (Signed)
Physician Discharge Summary  Phyllis Miller [facility]:[id number] DOB: 1961/09/30 DOA: 07/06/2020  PCP: Earnestine Leys, MD  Admit date: 07/06/2020 Discharge date: 07/11/2020  Time spent:  Recommendations for Outpatient Follow-up:   Covid vaccination; no Vaccine   Covid infection COVID-19 Labs  Recent Labs    07/09/20 1146 07/10/20 0736 07/11/20 0650  FERRITIN 539* 440* 473*  LDH 265* 248* 239*  CRP 1.3* 0.5 <0.5    Lab Results  Component Value Date   SARSCOV2NAA POSITIVE (A) 07/06/2020   -Currently patient on room air, and CRP minimally elevated.  Patient however was counseled that we must watch her closely as this could change rapidly at which point we would need to start high-dose steroids, remdesivir, and possibly Actemra dependent upon her Covid inflammatory marker, and symptoms. -Consult patient on the severity of current Covid pandemic and need to obtain vaccination, especially given her multiple medical conditions once she recovers. -Patient counseled that she will be infective for 3 weeks post discharge and needs to take universal precautions to keep from infecting other people.  OSA/OHS -CPAP per respiratory  Acute on CKD stage IIIa (baseline Cr 1.4-1.6) -Multifactorial Covid infection, dehydration. Recent Labs  Lab 07/07/20 0424 07/08/20 0406 07/09/20 1146 07/10/20 0736 07/11/20 0650  CREATININE 3.36* 2.75* 2.04* 1.79* 1.92*  -Follow-up with Dr. Charlaine Dalton in 3 weeks Covid infection, acute on CKD stage IIIa, diabetes type 2 controlled with complication, HLD.  DM type II controlled?  Uncontrolled? -8/5 Hemoglobin A1c= 6.5 -PCP to start patient on appropriate diabetic medication and monitor.  HLD -LDL = 185  -Atorvastatin 80 mg daily -8/5 Zetia 10 mg daily  Essential HTN -Amlodipine 5 mg daily -Enalapril 10 mg daily -Metoprolol 50 mg BID  Gout  Anxiety/depression -Wellbutrin 150 mg daily -Celexa 40 mg daily  Headache -Tylenol  PRN  Hypomagnesmia -Magnesium goal> 2 -Resolved  Hyperkalemia -Resolved    Discharge Diagnoses:  Active Problems:   Essential hypertension   Hyperlipidemia   Diabetes mellitus type 2 in obese (HCC)   AKI (acute kidney injury) (HCC)   COVID-19 virus infection   Morbidly obese (HCC)   Obesity hypoventilation syndrome (HCC)   Obstructive sleep apnea   CKD (chronic kidney disease), stage III   Acute renal failure superimposed on stage 3a chronic kidney disease (HCC)   Discharge Condition: Stable  Diet recommendation: Heart healthy/carb modified  Filed Weights   07/07/20 0156 07/07/20 1216  Weight: (!) 138.5 kg (!) 138.4 kg    History of present illness:  59 y.o.BF PMHx  CKD III (b/l Cr 1.4-1.6), HTN, HLD, DM type II, OSA/OHS,, MORBIDLY OBESE  Presents with concerns regarding COVID exposure and feeling ill.   Patient reports both daughters have tested positive for COVID and one of the is currently hospitalized for that reason She has felt ill for the past 14 days Daughter started coughing and feeling ill while they were together watching TV A few days later both her and another daughter who was present also started to feel ill First daughter is currently admitted to [facility] on 5th floor with COVID pneumonia Symptoms initially had cough, night sweats, aches in her legs, diarrhea, and poor appetite Currently just feels weak and fatigued, but denies shortness of breath, chest pain, fevers Neither patient or her daughters have had a COVID vaccine  In the ED her labs were notable for a positive COVID swab, AKI with Cr 3.74 but otherwise unremarkable CMP, unremarkable CBC,normal CXR,and unremarkable ECG. She was admitted for AKI and  given 1L NS in the ED,no further treatment given for COVID given patient not hypoxic.   Hospital Course:  See above   Cultures  8/3 SARS Coronavirus positive     Discharge Exam: Vitals:   07/10/20 1723 07/10/20 2117 07/11/20 0027  07/11/20 0346  BP: 107/76 110/88 (!) 110/52 (!) 142/77  Pulse: 78 80 81 82  Resp:  18 18 16   Temp:  97.9 F (36.6 C) 98.2 F (36.8 C) 97.7 F (36.5 C)  TempSrc:  Oral Oral Oral  SpO2:  98% 100% 100%  Weight:      Height:        General: A/O x4 No acute respiratory distress Eyes: negative scleral hemorrhage, negative anisocoria, negative icterus ENT: Negative Runny nose, negative gingival bleeding, Neck:  Negative scars, masses, torticollis, lymphadenopathy, JVD Lungs: Clear to auscultation bilaterally without wheezes or crackles Cardiovascular: Regular rate and rhythm without murmur gallop or rub normal S1 and S2 Abdomen: MORBIDLY OBESE abdominal pain, nondistended, positive soft, bowel sounds, no rebound, no ascites, no appreciable mass   Discharge Instructions   Allergies as of 07/11/2020      Reactions   Dust Mite Mixed Allergen Ext [mite (d. Farinae)]       Medication List    STOP taking these medications   hydrOXYzine 25 MG tablet Commonly known as: ATARAX/VISTARIL   methimazole 5 MG tablet Commonly known as: TAPAZOLE     TAKE these medications   amLODipine 5 MG tablet Commonly known as: NORVASC Take 5 mg by mouth daily.   aspirin EC 81 MG tablet Take 81 mg by mouth daily.   atorvastatin 80 MG tablet Commonly known as: LIPITOR Take 80 mg by mouth daily.   buPROPion 150 MG 24 hr tablet Commonly known as: WELLBUTRIN XL Take 1 tablet (150 mg total) by mouth daily. Start taking on: July 12, 2020 What changed:   medication strength  how much to take   citalopram 40 MG tablet Commonly known as: CELEXA Take 40 mg by mouth daily.   Colchicine 0.6 MG Caps Take 0.6 mg by mouth daily.   enalapril 10 MG tablet Commonly known as: VASOTEC Take 10 mg by mouth daily.   ezetimibe 10 MG tablet Commonly known as: ZETIA Take 1 tablet (10 mg total) by mouth daily. Start taking on: July 12, 2020   famotidine 20 MG tablet Commonly known as: PEPCID Take  20 mg by mouth daily.   metoprolol tartrate 50 MG tablet Commonly known as: LOPRESSOR Take 1 tablet (50 mg total) by mouth 2 (two) times daily. What changed:   medication strength  how much to take   omeprazole 20 MG capsule Commonly known as: PRILOSEC Take 1 capsule (20 mg total) by mouth 2 (two) times daily before a meal. What changed: when to take this   polyethylene glycol 17 g packet Commonly known as: MIRALAX / GLYCOLAX Take 17 g by mouth daily as needed for moderate constipation.   ProAir HFA 108 (90 Base) MCG/ACT inhaler Generic drug: albuterol Inhale 2 puffs into the lungs every 6 (six) hours as needed for wheezing or shortness of breath.   traZODone 50 MG tablet Commonly known as: DESYREL Take 0.5 tablets (25 mg total) by mouth at bedtime as needed for sleep.      Allergies  Allergen Reactions  . Dust Mite Mixed Allergen Ext [Mite (D. Farinae)]       The results of significant diagnostics from this hospitalization (including imaging, microbiology, ancillary and laboratory)  are listed below for reference.    Significant Diagnostic Studies: DG Chest Port 1 View  Result Date: 07/06/2020 CLINICAL DATA:  Cough. EXAM: PORTABLE CHEST 1 VIEW COMPARISON:  March 28, 2020. FINDINGS: Stable cardiomediastinal silhouette. No pneumothorax or pleural effusion is noted. Lungs are clear. Bony thorax is unremarkable. IMPRESSION: No active disease. Electronically Signed   By: Lupita Raider M.D.   On: 07/06/2020 14:43    Microbiology: Recent Results (from the past 240 hour(s))  SARS Coronavirus 2 by RT PCR (hospital order, performed in [facility] hospital lab) Nasopharyngeal Nasopharyngeal Swab     Status: Abnormal   Collection Time: 07/06/20  2:18 PM   Specimen: Nasopharyngeal Swab  Result Value Ref Range Status   SARS Coronavirus 2 POSITIVE (A) NEGATIVE Final    Comment: RESULT CALLED TO, READ BACK BY AND VERIFIED WITHDawna Part RN 1641 07/06/20 A  BROWNING (NOTE) SARS-CoV-2 target nucleic acids are DETECTED  SARS-CoV-2 RNA is generally detectable in upper respiratory specimens  during the acute phase of infection.  Positive results are indicative  of the presence of the identified virus, but do not rule out bacterial infection or co-infection with other pathogens not detected by the test.  Clinical correlation with patient history and  other diagnostic information is necessary to determine patient infection status.  The expected result is negative.  Fact Sheet for Patients:   BoilerBrush.com.cy   Fact Sheet for Healthcare Providers:   https://pope.com/    This test is not yet approved or cleared by the [place] FDA and  has been authorized for detection and/or diagnosis of SARS-CoV-2 by FDA under an Emergency Use Authorization (EUA).  This EUA will remain in effect (meaning thi s test can be used) for the duration of  the COVID-19 declaration under Section 564(b)(1) of the Act, 21 U.S.C. section 360-bbb-3(b)(1), unless the authorization is terminated or revoked sooner.  Performed at [facility], [address], [place], [place] [address]      Labs: Basic Metabolic Panel: Recent Labs  Lab 07/07/20 0424 07/07/20 1143 07/08/20 0406 07/09/20 1146 07/10/20 0736 07/11/20 0650  NA 141  --  145 145 142 144  K 4.1  --  4.7 5.1 5.1 5.1  CL 112*  --  116* 114* 115* 112*  CO2 20*  --  20* 21* 19* 22  GLUCOSE 139*  --  130* 101* 119* 107*  BUN 58*  --  44* 25* 18 19  CREATININE 3.36*  --  2.75* 2.04* 1.79* 1.92*  CALCIUM 8.7*  --  8.7* 8.8* 8.7* 9.0  MG  --  2.3 2.0 1.7 1.6* 1.8  PHOS  --  3.7 3.1 2.6 2.9 4.0   Liver Function Tests: Recent Labs  Lab 07/07/20 0424 07/08/20 0406 07/09/20 1146 07/10/20 0736 07/11/20 0650  AST 40 34 42* 42* 46*  ALT 38 38 47* 52* 63*  ALKPHOS 60 57 63 57 64  BILITOT 0.7 0.5 0.3 0.6 0.4  PROT 6.4* 6.0* 6.3* 5.7* 5.9*   ALBUMIN 2.3* 2.2* 2.2* 1.7* 2.1*   Recent Labs  Lab 07/06/20 1417  LIPASE 30   No results for input(s): AMMONIA in the last 168 hours. CBC: Recent Labs  Lab 07/07/20 0424 07/08/20 0406 07/09/20 1146 07/10/20 0736 07/11/20 0650  WBC 5.7 5.9 4.9 5.2 5.4  NEUTROABS 2.7 2.8 2.1 2.1 2.5  HGB 13.3 12.6 12.9 12.5 12.6  HCT 41.7 39.9 41.8 40.5 40.4  MCV 84.8 86.2 87.6 87.5 87.6  PLT 308 351 357 332 341   Cardiac Enzymes: No results for input(s): CKTOTAL, CKMB, CKMBINDEX, TROPONINI in the last 168 hours. BNP: BNP (last 3 results) No results for input(s): BNP in the last 8760 hours.  ProBNP (last 3 results) No results for input(s): PROBNP in the last 8760 hours.  CBG: Recent Labs  Lab 07/10/20 0817 07/10/20 1158 07/10/20 1711 07/10/20 2226 07/11/20 0807  GLUCAP 107* 104* 109* 92 86       Signed:  Carolyne Littles, MD [facility] [phone] pager

## 2021-02-02 ENCOUNTER — Emergency Department (HOSPITAL_COMMUNITY)
Admission: EM | Admit: 2021-02-02 | Discharge: 2021-02-03 | Disposition: A | Discharge status: Home / Self Care | Payer: Medicaid Other | Attending: Emergency Medicine | Admitting: Emergency Medicine

## 2021-02-02 ENCOUNTER — Encounter (HOSPITAL_COMMUNITY): Payer: Self-pay

## 2021-02-02 ENCOUNTER — Emergency Department (HOSPITAL_COMMUNITY): Payer: Medicaid Other

## 2021-02-02 DIAGNOSIS — Z87891 Personal history of nicotine dependence: Secondary | ICD-10-CM | POA: Diagnosis not present

## 2021-02-02 DIAGNOSIS — R002 Palpitations: Secondary | ICD-10-CM | POA: Diagnosis present

## 2021-02-02 DIAGNOSIS — N1831 Chronic kidney disease, stage 3a: Secondary | ICD-10-CM | POA: Insufficient documentation

## 2021-02-02 DIAGNOSIS — Z8616 Personal history of COVID-19: Secondary | ICD-10-CM | POA: Diagnosis not present

## 2021-02-02 DIAGNOSIS — Z79899 Other long term (current) drug therapy: Secondary | ICD-10-CM | POA: Diagnosis not present

## 2021-02-02 DIAGNOSIS — Z7982 Long term (current) use of aspirin: Secondary | ICD-10-CM | POA: Insufficient documentation

## 2021-02-02 DIAGNOSIS — E1122 Type 2 diabetes mellitus with diabetic chronic kidney disease: Secondary | ICD-10-CM | POA: Diagnosis not present

## 2021-02-02 DIAGNOSIS — I129 Hypertensive chronic kidney disease with stage 1 through stage 4 chronic kidney disease, or unspecified chronic kidney disease: Secondary | ICD-10-CM | POA: Diagnosis not present

## 2021-02-02 DIAGNOSIS — J45909 Unspecified asthma, uncomplicated: Secondary | ICD-10-CM | POA: Insufficient documentation

## 2021-02-02 LAB — CBC
HCT: 40.6 % (ref 36.0–46.0)
Hemoglobin: 12.7 g/dL (ref 12.0–15.0)
MCH: 27.4 pg (ref 26.0–34.0)
MCHC: 31.3 g/dL (ref 30.0–36.0)
MCV: 87.5 fL (ref 80.0–100.0)
Platelets: 269 10*3/uL (ref 150–400)
RBC: 4.64 MIL/uL (ref 3.87–5.11)
RDW: 14.5 % (ref 11.5–15.5)
WBC: 6.6 10*3/uL (ref 4.0–10.5)
nRBC: 0 % (ref 0.0–0.2)

## 2021-02-02 NOTE — ED Triage Notes (Signed)
Pt comes from home via [facility] for palpations that has been going on for 2 days, denies other cardiac symptoms.

## 2021-02-03 LAB — HCG, QUANTITATIVE, PREGNANCY: hCG, Beta Chain, Quant, S: 3 m[IU]/mL (ref <5)

## 2021-02-03 LAB — BASIC METABOLIC PANEL
Anion gap: 8 (ref 5–15)
BUN: 32 mg/dL — ABNORMAL HIGH (ref 6–20)
CO2: 21 mmol/L — ABNORMAL LOW (ref 22–32)
Calcium: 8.8 mg/dL — ABNORMAL LOW (ref 8.9–10.3)
Chloride: 108 mmol/L (ref 98–111)
Creatinine, Ser: 2.38 mg/dL — ABNORMAL HIGH (ref 0.44–1.00)
GFR, Estimated: 23 mL/min — ABNORMAL LOW (ref >60)
Glucose, Bld: 102 mg/dL — ABNORMAL HIGH (ref 70–99)
Potassium: 4.3 mmol/L (ref 3.5–5.1)
Sodium: 137 mmol/L (ref 135–145)

## 2021-02-03 LAB — TROPONIN I (HIGH SENSITIVITY)
Troponin I (High Sensitivity): 4 ng/L (ref <18)
Troponin I (High Sensitivity): 7 ng/L (ref <18)

## 2021-02-03 MED ORDER — METOPROLOL TARTRATE 25 MG PO TABS
25.0000 mg | ORAL_TABLET | Freq: Once | ORAL | Status: AC
  Administered 2021-02-03: 25 mg via ORAL
  Filled 2021-02-03: qty 1

## 2021-02-03 NOTE — Discharge Instructions (Addendum)
Call your cardiologist to schedule follow-up for repeat evaluation and possible long-term outpatient monitoring for your palpitations.

## 2021-02-03 NOTE — ED Provider Notes (Signed)
[facility] EMERGENCY DEPARTMENT Provider Note   CSN: [id number] Arrival date & time: 02/02/21  2317     History No chief complaint on file.   Phyllis Miller is a 60 y.o. female.  Patient presents to the emergency department for evaluation of heart palpitations.  Patient reports intermittent episodes of feeling a deep fluttering in her chest for the last 2 days.  Episodes occur intermittently and randomly.  No associated chest pain.        Past Medical History:  Diagnosis Date  . Asthma   . Bronchitis   . CKD (chronic kidney disease), stage III (HCC)   . Depression   . Heart palpitations   . Hypercholesteremia   . Hypertension   . Morbid obesity (HCC)   . Sleep apnea     Patient Active Problem List   Diagnosis Date Noted  . Morbidly obese (HCC) 07/07/2020  . Obesity hypoventilation syndrome (HCC) 07/07/2020  . Obstructive sleep apnea 07/07/2020  . CKD (chronic kidney disease), stage III (HCC) 07/07/2020  . Acute renal failure superimposed on stage 3a chronic kidney disease (HCC) 07/07/2020  . AKI (acute kidney injury) (HCC) 07/06/2020  . COVID-19 virus infection 07/06/2020  . Chest pain 08/21/2018  . Essential hypertension 08/21/2018  . Hyperlipidemia 08/21/2018  . Diabetes mellitus type 2 in obese (HCC) 08/21/2018    Past Surgical History:  Procedure Laterality Date  . ABDOMINAL HYSTERECTOMY    . TONSILLECTOMY       OB History   No obstetric history on file.     Family History  Problem Relation Age of Onset  . CAD Brother     Social History   Tobacco Use  . Smoking status: Former Smoker    Types: Cigarettes    Quit date: 07/19/2013    Years since quitting: 7.5  . Smokeless tobacco: Never Used  Substance Use Topics  . Alcohol use: No  . Drug use: Never    Home Medications Prior to Admission medications   Medication Sig Start Date End Date Taking? Authorizing Provider  albuterol (PROAIR HFA) 108 (90 Base) MCG/ACT  inhaler Inhale 2 puffs into the lungs every 6 (six) hours as needed for wheezing or shortness of breath.  02/29/16 07/07/20  [provider]  amLODipine (NORVASC) 5 MG tablet Take 5 mg by mouth daily.  05/03/16 07/07/20  [provider]  aspirin EC 81 MG tablet Take 81 mg by mouth daily.     [provider]  atorvastatin (LIPITOR) 80 MG tablet Take 80 mg by mouth daily.    [provider]  buPROPion (WELLBUTRIN XL) 150 MG 24 hr tablet Take 1 tablet (150 mg total) by mouth daily. 07/12/20   Drema Dallas, MD  citalopram (CELEXA) 40 MG tablet Take 40 mg by mouth daily.  08/26/10   [provider]  Colchicine 0.6 MG CAPS Take 0.6 mg by mouth daily.  02/05/18   [provider]  enalapril (VASOTEC) 10 MG tablet Take 10 mg by mouth daily.  04/13/16   [provider]  ezetimibe (ZETIA) 10 MG tablet Take 1 tablet (10 mg total) by mouth daily. 07/12/20   Drema Dallas, MD  famotidine (PEPCID) 20 MG tablet Take 20 mg by mouth daily.  08/20/12   [provider]  metoprolol tartrate (LOPRESSOR) 50 MG tablet Take 1 tablet (50 mg total) by mouth 2 (two) times daily. 07/11/20   Drema Dallas, MD  omeprazole (PRILOSEC) 20 MG capsule  Take 1 capsule (20 mg total) by mouth 2 (two) times daily before a meal. Patient taking differently: Take 20 mg by mouth daily.  03/28/20   Fayrene Helper, PA-C  polyethylene glycol [facility] / Ethelene Hal) packet Take 17 g by mouth daily as needed for moderate constipation.  08/30/17   [provider]  traZODone (DESYREL) 50 MG tablet Take 0.5 tablets (25 mg total) by mouth at bedtime as needed for sleep. 07/11/20   Drema Dallas, MD    Allergies    Dust mite mixed allergen ext [mite (d. farinae)]  Review of Systems   Review of Systems  Cardiovascular: Positive for palpitations.  All other systems reviewed and are negative.   Physical Exam Updated Vital Signs BP (!) 132/98   Pulse 79   Temp 97.8 F (36.6 C)  (Oral)   Resp (!) 23   SpO2 100%   Physical Exam Vitals and nursing note reviewed.  Constitutional:      General: She is not in acute distress.    Appearance: Normal appearance. She is well-developed and well-nourished.  HENT:     Head: Normocephalic and atraumatic.     Right Ear: Hearing normal.     Left Ear: Hearing normal.     Nose: Nose normal.     Mouth/Throat:     Mouth: Oropharynx is clear and moist and mucous membranes are normal.  Eyes:     Extraocular Movements: EOM normal.     Conjunctiva/sclera: Conjunctivae normal.     Pupils: Pupils are equal, round, and reactive to light.  Cardiovascular:     Rate and Rhythm: Regular rhythm. Occasional extrasystoles are present.    Heart sounds: S1 normal and S2 normal. No murmur heard. No friction rub. No gallop.   Pulmonary:     Effort: Pulmonary effort is normal. No respiratory distress.     Breath sounds: Normal breath sounds.  Chest:     Chest wall: No tenderness.  Abdominal:     General: Bowel sounds are normal.     Palpations: Abdomen is soft. There is no hepatosplenomegaly.     Tenderness: There is no abdominal tenderness. There is no guarding or rebound. Negative signs include Murphy's sign and McBurney's sign.     Hernia: No hernia is present.  Musculoskeletal:        General: Normal range of motion.     Cervical back: Normal range of motion and neck supple.  Skin:    General: Skin is warm, dry and intact.     Findings: No rash.     Nails: There is no cyanosis.  Neurological:     Mental Status: She is alert and oriented to person, place, and time.     GCS: GCS eye subscore is 4. GCS verbal subscore is 5. GCS motor subscore is 6.     Cranial Nerves: No cranial nerve deficit.     Sensory: No sensory deficit.     Coordination: Coordination normal.     Deep Tendon Reflexes: Strength normal.  Psychiatric:        Mood and Affect: Mood and affect normal.        Speech: Speech normal.        Behavior: Behavior  normal.        Thought Content: Thought content normal.     ED Results / Procedures / Treatments   Labs (all labs ordered are listed, but only abnormal results are displayed) Labs Reviewed  BASIC METABOLIC PANEL - Abnormal;  Notable for the following components:      Result Value   CO2 21 (*)    Glucose, Bld 102 (*)    BUN 32 (*)    Creatinine, Ser 2.38 (*)    Calcium 8.8 (*)    GFR, Estimated 23 (*)    All other components within normal limits  CBC  HCG, QUANTITATIVE, PREGNANCY  TROPONIN I (HIGH SENSITIVITY)  TROPONIN I (HIGH SENSITIVITY)    EKG EKG Interpretation  Date/Time:  Wednesday February 02 2021 23:26:02 EST Ventricular Rate:  74 PR Interval:  174 QRS Duration: 68 QT Interval:  376 QTC Calculation: 417 R Axis:   56 Text Interpretation: Normal sinus rhythm Normal ECG Confirmed by Gilda Crease [phone]) on 02/03/2021 4:56:47 AM   Radiology DG Chest 2 View  Result Date: 02/02/2021 CLINICAL DATA:  Chest pain EXAM: CHEST - 2 VIEW COMPARISON:  07/06/2020 FINDINGS: Cardiophrenic angle opacity is again seen likely representing a pericardial cyst or prominent epicardial fat. The lungs are otherwise clear. No pneumothorax or pleural effusion. Cardiac size within normal limits. Pulmonary vascularity is normal. No acute bone abnormality. IMPRESSION: No active cardiopulmonary disease. Electronically Signed   By: Helyn Numbers MD   On: 02/02/2021 23:42    Procedures Procedures   Medications Ordered in ED Medications  metoprolol tartrate (LOPRESSOR) tablet 25 mg (25 mg Oral Given 02/03/21 0814)    ED Course  I have reviewed the triage vital signs and the nursing notes.  Pertinent labs & imaging results that were available during my care of the patient were reviewed by me and considered in my medical decision making (see chart for details).    MDM Rules/Calculators/A&P                          Patient presents to the emergency department for evaluation of heart  palpitations.  Symptoms have been occurring intermittently for 2 days.  Patient has a history of chronic kidney disease, creatinine is at about her baseline with normal electrolytes.  Patient is not experiencing any chest pain.  Work-up has been very reassuring.  EKG without any worrisome findings.  Patient is on the monitor currently, palpitations coincide with frequent PACs that are seen which is reassuring.  Patient will be given an additional dose of Lopressor, told she can take an extra half a Lopressor tablet daily as needed until she can follow-up with her cardiologist.  Final Clinical Impression(s) / ED Diagnoses Final diagnoses:  Palpitations    Rx / DC Orders ED Discharge Orders    None       Tocara Mennen, Canary Brim, MD 02/03/21 [phone]

## 2021-05-04 IMAGING — DX DG CHEST 2V
2 series · 2 of 2 positions shown · non-contrast
Comparison: 08/21/2018

CLINICAL DATA: Chest pain, shortness of breath

EXAM:
CHEST - 2 VIEW

[w chest pa]
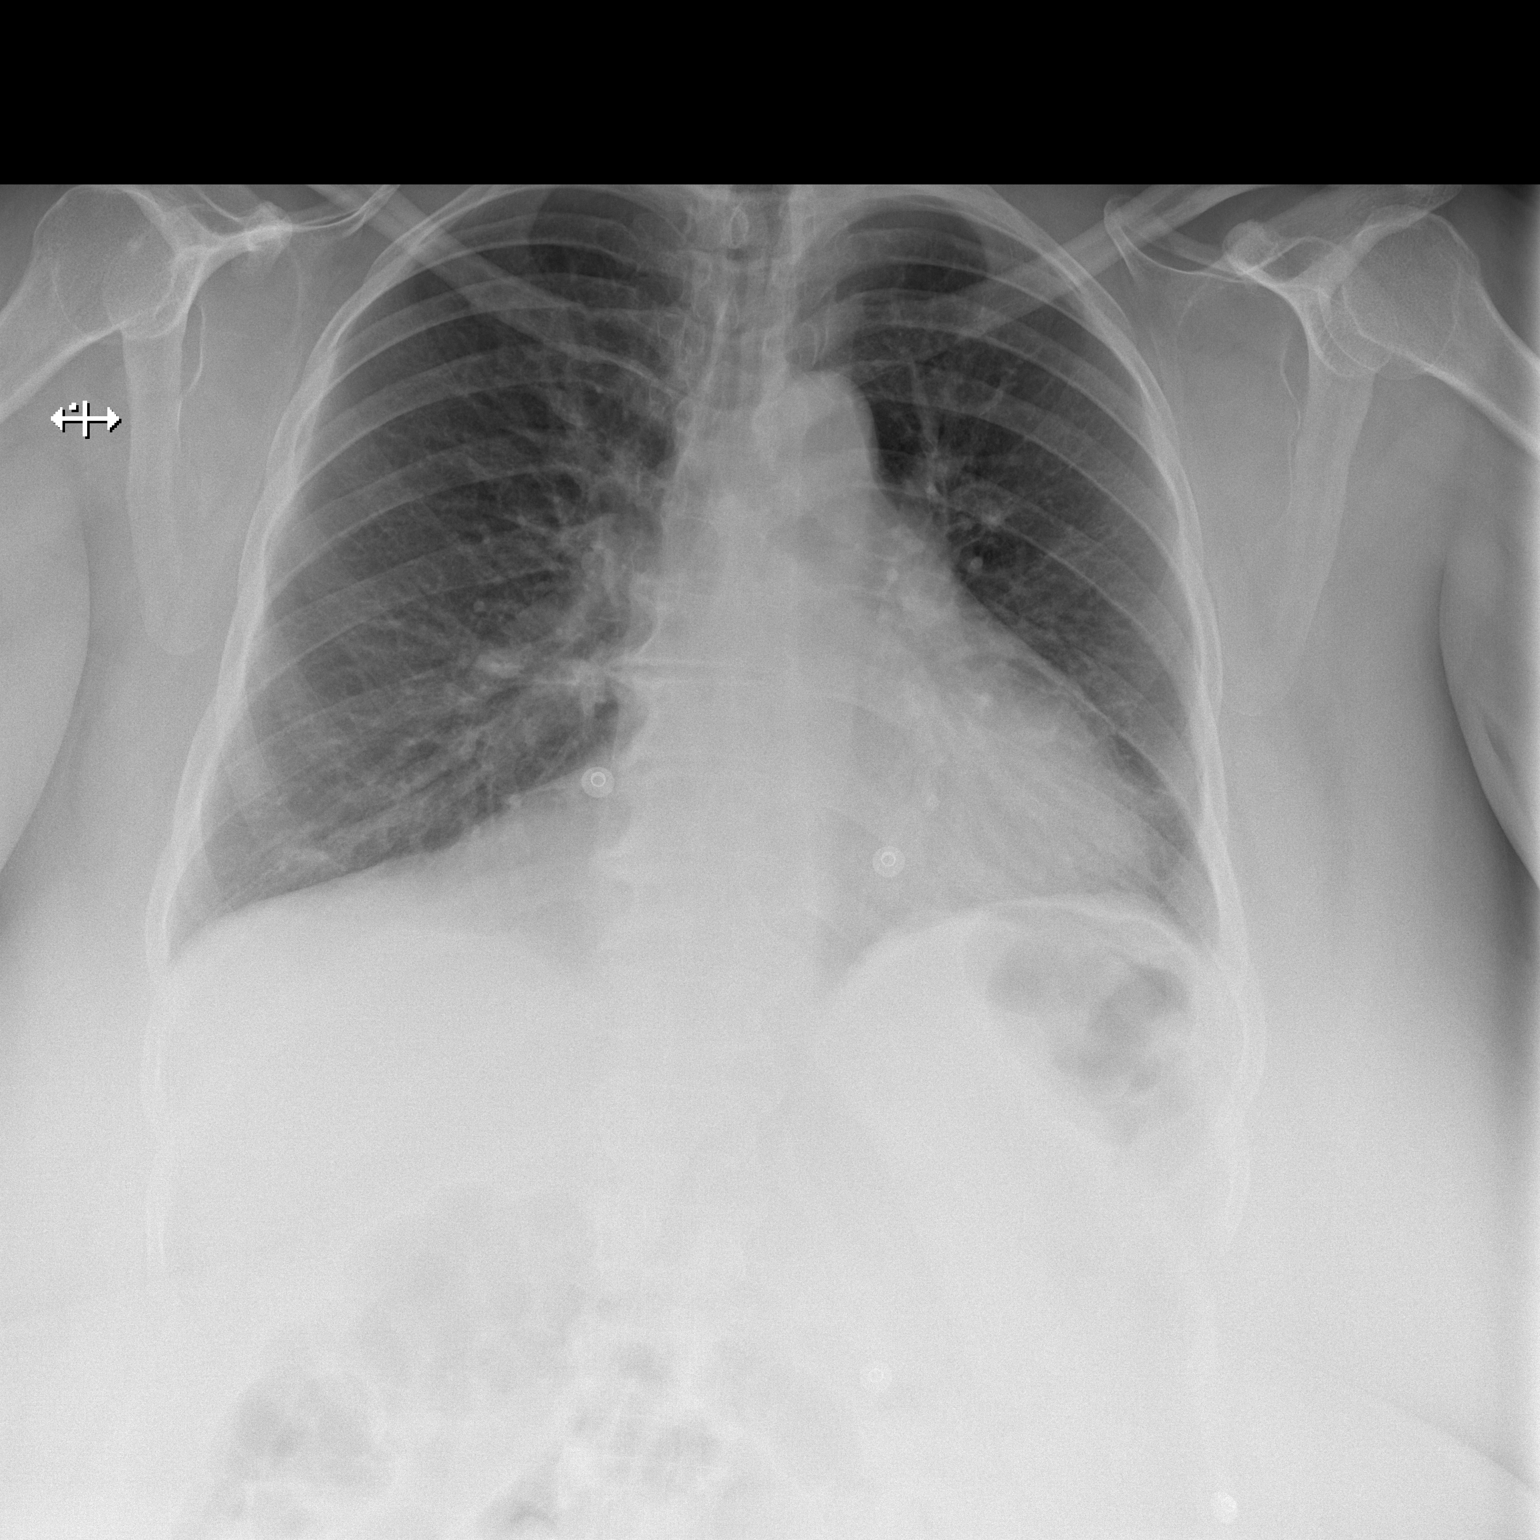

[w chest lat]
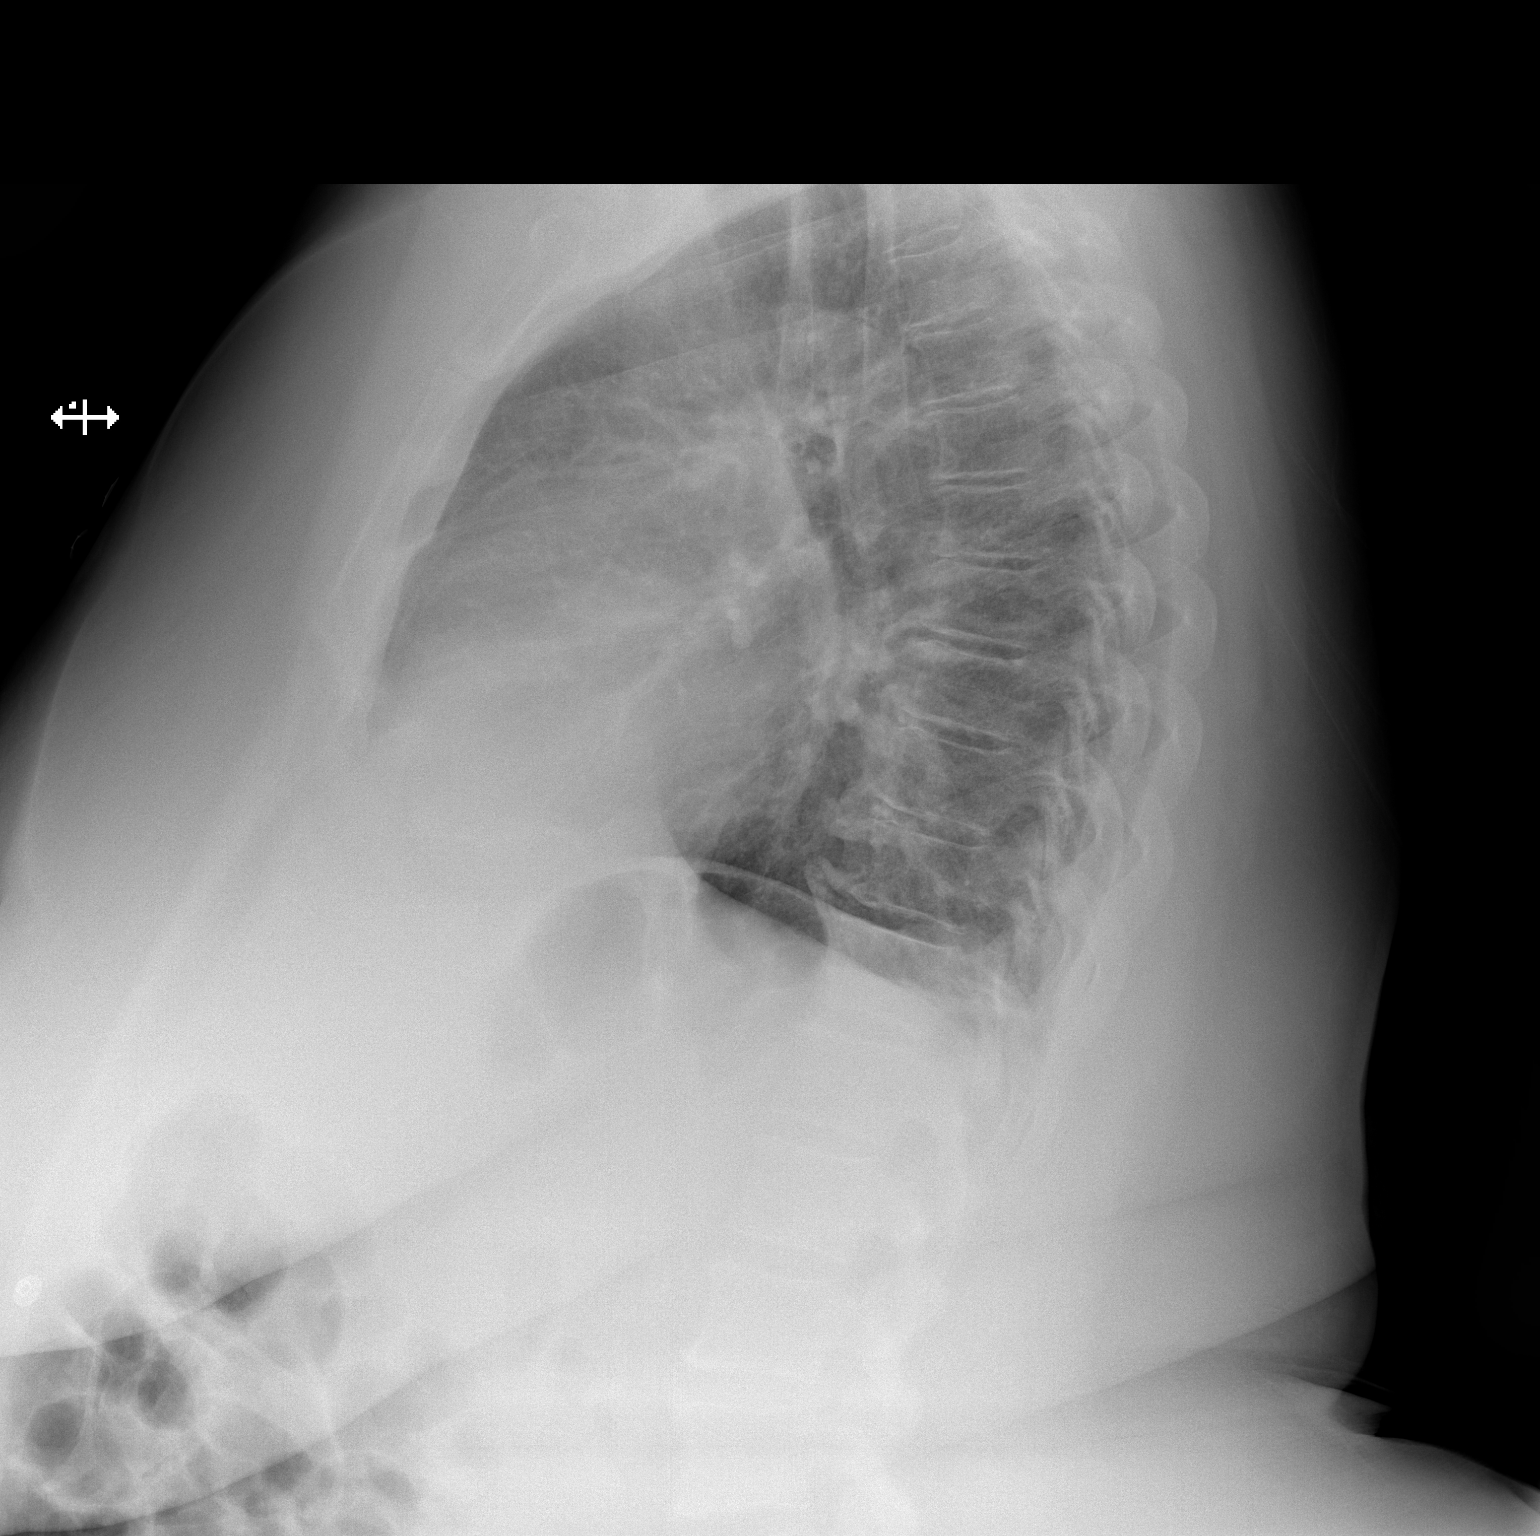

[2 of 2 positions shown; findings below may reference images not displayed]

FINDINGS: The heart size and mediastinal contours are within normal limits. No
focal airspace consolidation, pleural effusion, or pneumothorax. The
visualized skeletal structures are unremarkable.
IMPRESSION: No active cardiopulmonary disease.

## 2021-12-24 ENCOUNTER — Other Ambulatory Visit: Payer: Self-pay

## 2021-12-24 ENCOUNTER — Emergency Department (HOSPITAL_COMMUNITY): Payer: Medicaid Other

## 2021-12-24 ENCOUNTER — Emergency Department (HOSPITAL_COMMUNITY)
Admission: EM | Admit: 2021-12-24 | Discharge: 2021-12-24 | Disposition: A | Discharge status: Home / Self Care | Payer: Medicaid Other | Attending: Emergency Medicine | Admitting: Emergency Medicine

## 2021-12-24 ENCOUNTER — Encounter (HOSPITAL_COMMUNITY): Payer: Self-pay

## 2021-12-24 DIAGNOSIS — M79642 Pain in left hand: Secondary | ICD-10-CM | POA: Diagnosis present

## 2021-12-24 DIAGNOSIS — M109 Gout, unspecified: Secondary | ICD-10-CM

## 2021-12-24 DIAGNOSIS — Z7982 Long term (current) use of aspirin: Secondary | ICD-10-CM | POA: Insufficient documentation

## 2021-12-24 LAB — CBC WITH DIFFERENTIAL/PLATELET
Abs Immature Granulocytes: 0.06 10*3/uL (ref 0.00–0.07)
Basophils Absolute: 0 10*3/uL (ref 0.0–0.1)
Basophils Relative: 0 %
Eosinophils Absolute: 0.3 10*3/uL (ref 0.0–0.5)
Eosinophils Relative: 3 %
HCT: 37.6 % (ref 36.0–46.0)
Hemoglobin: 11.9 g/dL — ABNORMAL LOW (ref 12.0–15.0)
Immature Granulocytes: 1 %
Lymphocytes Relative: 19 %
Lymphs Abs: 2 10*3/uL (ref 0.7–4.0)
MCH: 27.7 pg (ref 26.0–34.0)
MCHC: 31.6 g/dL (ref 30.0–36.0)
MCV: 87.4 fL (ref 80.0–100.0)
Monocytes Absolute: 1.1 10*3/uL — ABNORMAL HIGH (ref 0.1–1.0)
Monocytes Relative: 10 %
Neutro Abs: 7 10*3/uL (ref 1.7–7.7)
Neutrophils Relative %: 67 %
Platelets: 272 10*3/uL (ref 150–400)
RBC: 4.3 MIL/uL (ref 3.87–5.11)
RDW: 15.1 % (ref 11.5–15.5)
WBC: 10.5 10*3/uL (ref 4.0–10.5)
nRBC: 0 % (ref 0.0–0.2)

## 2021-12-24 LAB — BASIC METABOLIC PANEL
Anion gap: 9 (ref 5–15)
BUN: 42 mg/dL — ABNORMAL HIGH (ref 6–20)
CO2: 24 mmol/L (ref 22–32)
Calcium: 9.3 mg/dL (ref 8.9–10.3)
Chloride: 108 mmol/L (ref 98–111)
Creatinine, Ser: 2.46 mg/dL — ABNORMAL HIGH (ref 0.44–1.00)
GFR, Estimated: 22 mL/min — ABNORMAL LOW (ref >60)
Glucose, Bld: 120 mg/dL — ABNORMAL HIGH (ref 70–99)
Potassium: 4.6 mmol/L (ref 3.5–5.1)
Sodium: 141 mmol/L (ref 135–145)

## 2021-12-24 LAB — URIC ACID: Uric Acid, Serum: 8.3 mg/dL — ABNORMAL HIGH (ref 2.5–7.1)

## 2021-12-24 MED ORDER — OXYCODONE-ACETAMINOPHEN 5-325 MG PO TABS: 1.0000 | ORAL_TABLET | Freq: Four times a day (QID) | ORAL | 0 refills | Status: AC | PRN

## 2021-12-24 MED ORDER — COLCHICINE 0.6 MG PO TABS
0.6000 mg | ORAL_TABLET | Freq: Once | ORAL | Status: DC
Start: 2021-12-24 — End: 2021-12-24

## 2021-12-24 MED ORDER — PREDNISONE 20 MG PO TABS: 40.0000 mg | ORAL_TABLET | Freq: Every day | ORAL | 0 refills | Status: AC

## 2021-12-24 MED ORDER — PREDNISONE 20 MG PO TABS: 40.0000 mg | ORAL_TABLET | Freq: Every day | ORAL | 0 refills | Status: DC

## 2021-12-24 MED ORDER — MORPHINE SULFATE (PF) 4 MG/ML IV SOLN
6.0000 mg | Freq: Once | INTRAVENOUS | Status: AC
  Administered 2021-12-24: 6 mg via INTRAVENOUS
  Filled 2021-12-24: qty 2

## 2021-12-24 MED ORDER — OXYCODONE-ACETAMINOPHEN 5-325 MG PO TABS
1.0000 | ORAL_TABLET | Freq: Once | ORAL | Status: AC
  Administered 2021-12-24: 1 via ORAL
  Filled 2021-12-24: qty 1

## 2021-12-24 MED ORDER — COLCHICINE 0.6 MG PO TABS: 0.6000 mg | ORAL_TABLET | Freq: Every day | ORAL | 0 refills | Status: AC

## 2021-12-24 MED ORDER — COLCHICINE 0.3 MG HALF TABLET
0.3000 mg | ORAL_TABLET | Freq: Once | ORAL | Status: AC
  Administered 2021-12-24: 0.3 mg via ORAL
  Filled 2021-12-24: qty 1

## 2021-12-24 MED ORDER — COLCHICINE 0.6 MG PO TABS: 0.3000 mg | ORAL_TABLET | Freq: Every day | ORAL | 0 refills | Status: DC

## 2021-12-24 MED ORDER — OXYCODONE-ACETAMINOPHEN 5-325 MG PO TABS
1.0000 | ORAL_TABLET | Freq: Four times a day (QID) | ORAL | 0 refills | Status: DC | PRN
Start: 2021-12-24 — End: 2021-12-24

## 2021-12-24 MED ORDER — MORPHINE SULFATE (PF) 4 MG/ML IV SOLN: 6.0000 mg | Freq: Once | INTRAVENOUS | Status: DC

## 2021-12-24 MED ORDER — METHYLPREDNISOLONE SODIUM SUCC 125 MG IJ SOLR
125.0000 mg | Freq: Once | INTRAMUSCULAR | Status: AC
  Administered 2021-12-24: 125 mg via INTRAVENOUS
  Filled 2021-12-24: qty 2

## 2021-12-24 NOTE — ED Notes (Signed)
Pt took tylenol one hour prior to arrival, unable to take NSAIDS due to kidney function issues

## 2021-12-24 NOTE — Discharge Instructions (Addendum)
You are diagnosed with an acute flareup of your gouty arthritis.  I have sent you home with a brace and a splint which may provide some support for you.  You given a dose of steroids and colchicine while you are here in the ED today.  I sent in a prescription to the [facility] pharmacy for both prednisone and colchicine that you can start tomorrow which will help your symptoms.  I have also sent you in a few pain pills that you can use as needed for your pain.  Please follow-up with your primary care doctor to monitor treatment progress and determine if any other medication changes need to be made.  Please return if you develop worsening fevers, chest pain, shortness of breath.

## 2021-12-24 NOTE — ED Notes (Signed)
Call to lab to notify of add-on uric acid to existing labwork.

## 2021-12-24 NOTE — Care Management (Signed)
Consult for Medication assistance.Patient has Medicaid and is ineligible for [facility] medication assistance.

## 2021-12-24 NOTE — ED Notes (Signed)
Pt is requesting medication for pain. Secure chat sent to PA regarding request for pain meds as well as HR in 120s. Awaiting response.

## 2021-12-24 NOTE — ED Notes (Signed)
AVS with prescriptions provided to and discussed with patient. Pt verbalizes understanding of discharge instructions and denies any questions or concerns at this time. Pt taken out of department via stretcher to be transported home via [facility].

## 2021-12-24 NOTE — ED Notes (Signed)
Per Dr. Eulis Foster, HR 120 ok for discharge.

## 2021-12-24 NOTE — ED Notes (Signed)
ED Provider at bedside. 

## 2021-12-24 NOTE — ED Notes (Signed)
Pt requires PTAR for transport home, arrangements in process

## 2021-12-24 NOTE — ED Provider Notes (Signed)
°  Face-to-face evaluation   History: She presents for evaluation of gradually worse pain and swelling in left wrist and hand, for several days without known trauma.  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  She has previously been treated for gout with colchicine but not currently taking it.  Physical exam: Obese, alert and cooperative.  Left wrist is tender and swollen consistent with joint inflammation and there is also associated pain and swelling of the dorsum of the left hand.  She has pain with any movement of the fingers, cannot extend well or flex secondary to the pain.  She has difficulty extending and flexing the wrist.  MDM: Evaluation for  Chief Complaint  Patient presents with   Extremity Pain    Left arm, without fall/injury     Patient with likely gout flare, presenting with atraumatic left wrist pain.  X-rays do not indicate acute processes or joint erosion.  Medical screening examination/treatment/procedure(s) were conducted as a shared visit with non-physician practitioner(s) and myself.  I personally evaluated the patient during the encounter    Mancel Bale, MD 12/29/21 [phone]

## 2021-12-24 NOTE — ED Provider Notes (Signed)
[facility] EMERGENCY DEPARTMENT Provider Note   CSN: [id number] Arrival date & time: 12/24/21  2409     History  Chief Complaint  Patient presents with   Extremity Pain    Left arm, without fall/injury    Phyllis Miller is a 61 y.o. female who presents for evaluation of left forearm and hand pain that started 4 to 5 days ago and has been getting worse.  Patient denies trauma or injury to the area.  No previous history of similar symptoms.  She states the hand and wrist are the most painful and feel warm and swollen.  She has been putting ice to the area several times a day without relief.  She has been taking Tylenol without improvement.  She is unable to take NSAIDs due to kidney disease.  She denies numbness tingling.  She denies fevers, chills, chest pain, abdominal pain, N/V/D.   Extremity Pain Pertinent negatives include no abdominal pain, no headaches and no shortness of breath.      Home Medications Prior to Admission medications   Medication Sig Start Date End Date Taking? Authorizing Provider  albuterol (VENTOLIN HFA) 108 (90 Base) MCG/ACT inhaler Inhale 2 puffs into the lungs every 6 (six) hours as needed for wheezing or shortness of breath.  02/29/16 02/03/21  [provider]  amLODipine (NORVASC) 10 MG tablet Take 10 mg by mouth daily. 05/03/16 02/03/21  [provider]  aspirin EC 81 MG tablet Take 81 mg by mouth daily.     [provider]  atorvastatin (LIPITOR) 80 MG tablet Take 80 mg by mouth daily. Patient not taking: Reported on 02/03/2021    [provider]  buPROPion (WELLBUTRIN XL) 150 MG 24 hr tablet Take 1 tablet (150 mg total) by mouth daily. Patient taking differently: Take 300 mg by mouth daily. 07/12/20   Drema Dallas, MD  citalopram (CELEXA) 40 MG tablet Take 40 mg by mouth daily.  08/26/10   [provider]  Colchicine 0.6 MG CAPS Take 0.6 mg by mouth daily.  02/05/18   [provider]   enalapril (VASOTEC) 10 MG tablet Take 10 mg by mouth daily.  Patient not taking: Reported on 02/03/2021 04/13/16   [provider]  famotidine (PEPCID) 20 MG tablet Take 20 mg by mouth daily.  08/20/12   [provider]  hydrOXYzine (ATARAX/VISTARIL) 25 MG tablet Take 25 mg by mouth 3 (three) times daily as needed for anxiety or itching. 11/05/20   [provider]  methimazole (TAPAZOLE) 5 MG tablet Take 5 mg by mouth daily. 05/19/20   [provider]  metoprolol tartrate (LOPRESSOR) 50 MG tablet Take 1 tablet (50 mg total) by mouth 2 (two) times daily. Patient taking differently: Take 100 mg by mouth 2 (two) times daily. 07/11/20   Drema Dallas, MD  omeprazole (PRILOSEC) 20 MG capsule Take 1 capsule (20 mg total) by mouth 2 (two) times daily before a meal. Patient taking differently: Take 20 mg by mouth daily. 03/28/20   Fayrene Helper, PA-C  polyethylene glycol [facility] / Ethelene Hal) packet Take 17 g by mouth daily as needed for moderate constipation.  08/30/17   [provider]  traZODone (DESYREL) 50 MG tablet Take 0.5 tablets (25 mg total) by mouth at bedtime as needed for sleep. Patient not taking: No sig reported 07/11/20   Drema Dallas, MD      Allergies    Dust mite mixed allergen ext [mite (d. farinae)]  Review of Systems   Review of Systems  Constitutional:  Negative for fever.  HENT: Negative.    Eyes: Negative.   Respiratory:  Negative for shortness of breath.   Cardiovascular: Negative.   Gastrointestinal:  Negative for abdominal pain and vomiting.  Endocrine: Negative.   Genitourinary: Negative.   Musculoskeletal:  Positive for arthralgias and myalgias.  Skin:  Negative for rash.  Neurological:  Negative for headaches.  All other systems reviewed and are negative.  Physical Exam Updated Vital Signs BP (!) 165/88 (BP Location: Right Arm)    Pulse (!) 110    Temp 98.1 F (36.7 C) (Oral)    Resp 18    Ht 5\' 4"  (1.626 m)    Wt 136.1  kg    SpO2 100%    BMI 51.49 kg/m  Physical Exam Vitals and nursing note reviewed.  Constitutional:      General: She is not in acute distress.    Appearance: She is not ill-appearing.  HENT:     Head: Atraumatic.  Eyes:     Conjunctiva/sclera: Conjunctivae normal.  Cardiovascular:     Rate and Rhythm: Normal rate and regular rhythm.     Pulses: Normal pulses.     Heart sounds: No murmur heard. Pulmonary:     Effort: Pulmonary effort is normal. No respiratory distress.     Breath sounds: Normal breath sounds.  Abdominal:     General: Abdomen is flat. There is no distension.     Palpations: Abdomen is soft.     Tenderness: There is no abdominal tenderness.  Musculoskeletal:        General: Normal range of motion.     Cervical back: Normal range of motion.     Comments: Left hand with mild swelling and erythema of the dorsal hand and wrist.  No palpable deformities.  2+ DP pulses bilaterally.  Skin is exquisitely tender to touch.  Sensation is intact.  Good thumb to pinky opposition.  Full ROM of her left arm.  Skin:    General: Skin is warm and dry.     Capillary Refill: Capillary refill takes less than 2 seconds.  Neurological:     General: No focal deficit present.     Mental Status: She is alert.  Psychiatric:        Mood and Affect: Mood normal.       ED Results / Procedures / Treatments   Labs (all labs ordered are listed, but only abnormal results are displayed) Labs Reviewed  CBC WITH DIFFERENTIAL/PLATELET  BASIC METABOLIC PANEL    EKG None  Radiology No results found.  Procedures Procedures    Medications Ordered in ED Medications  morphine 4 MG/ML injection 6 mg (has no administration in time range)    ED Course/ Medical Decision Making/ A&P Clinical Course as of 12/27/21 1519  Sat Dec 24, 2021  1103 CBC with Differential(!) CBC normal [EC]  1136 Basic metabolic panel(!) BMP at baseline [EC]  1139 DG Wrist Complete Left Wrist with out any  bony abnormality, some soft tissue swelling [EC]  1139 DG Hand Complete Left Hand without any bony abnormality, there is some soft tissue swelling. [EC]  1332 Uric acid(!) Uric acid elevated, symptoms most  [EC]    Clinical Course User Index [EC] Janell Quietonklin, Ameen Mostafa R, PA-C                           Medical Decision Making Amount  and/or Complexity of Data Reviewed Labs: ordered. Decision-making details documented in ED Course. Radiology: ordered. Decision-making details documented in ED Course.  Risk Prescription drug management.   History:  Phyllis Miller is a 61 y.o. female who presents for evaluation of left forearm and hand pain that started 4 to 5 days ago and has been getting worse.  Patient denies trauma or injury to the area.  No previous history of similar symptoms.  She states the hand and wrist are the most painful and feel warm and swollen.  She has been putting ice to the area several times a day without relief.  She has been taking Tylenol without improvement.  She is unable to take NSAIDs due to kidney disease.  She denies numbness tingling.  She denies fevers, chills, chest pain, abdominal pain, N/V/D. This patient presents to the ED for concern of left arm pain, this involves an extensive number of treatment options, and is a complaint that carries with it a high risk of complications and morbidity.   The differentials for this complaint include cellulitis, inflammatory arthritis, septic joint, bug bite, fracture, strain, thrombophlebitis.  Initial impression:  Patient seems to be in quite a bit of discomfort.  She is writhing around the bed in extreme pain.  She winces at slightest touch of forearm.  Dorsal hand and wrist do appear slightly erythematous, warm to touch.  She is otherwise nontoxic-appearing.  I will order her some pain meds and proceed with basic labs and x-ray of the hand and wrist.  Lab Tests and EKG:  I Ordered, reviewed, and interpreted labs and EKG.   The pertinent results in my decision-making regarding them are detailed in the ED course and/or initial impression section above.   Imaging Studies ordered:  I ordered imaging studies including x-ray wrist and hand I independently visualized and interpreted imaging and I agree with the radiologist interpretation. Decisions made regarding results are detailed in the ED course and/or initial impression section above.    Medicines ordered and prescription drug management:  I ordered medication including: Percocet and morphine for pain  Solumedrol 125mg  and colchicine 0.3mg  for gout Reevaluation of the patient after these medicines showed that the patient improved I have reviewed the patients home medicines and have made adjustments as needed    Consultations Obtained:  I requested consultation with pharmacy,  and discussed lab and imaging findings as well as pertinent plan - they recommend: colchicine at 0.3mg  vs 0.6mg  given her CKD   Reevaluation:  After the interventions noted above, I reevaluated the patient and found that they have :improved   Disposition:  After consideration of the diagnostic results, physical exam, history and the patients response to treatment feel that the patent would benefit from discharge with OP follow up.   Gouty arthritis: Symptoms appear consistent with gouty arthritis versus cellulitis.  Patient has not been taking her gout medications which may have caused a flareup.  Uric acid was also elevated.  First dose of steroid given here today, patient sent home with short course of prednisone for 5 days.  Additionally given pain management as well as colchicine.  Pharmacist recommend 0.3 mg given her CKD, however upon chart review, her standard dose that she was previously taking was 0.6 mg colchicine.  I have sent her her previous dose and recommend she follow-up with her primary care doctor for further management.  All questions asked and answered.   Discharged home in good condition.    Final Clinical Impression(s) /  ED Diagnoses Final diagnoses:  Gouty arthritis of left hand    Rx / DC Orders ED Discharge Orders          Ordered    predniSONE (DELTASONE) 20 MG tablet  Daily        12/24/21 1231    colchicine 0.6 MG tablet  Daily,   Status:  Discontinued        12/24/21 1226    predniSONE (DELTASONE) 20 MG tablet  Daily,   Status:  Discontinued        12/24/21 1226    oxyCODONE-acetaminophen (PERCOCET/ROXICET) 5-325 MG tablet  Every 6 hours PRN,   Status:  Discontinued        12/24/21 1226    colchicine 0.6 MG tablet  Daily        12/24/21 1231    oxyCODONE-acetaminophen (PERCOCET/ROXICET) 5-325 MG tablet  Every 6 hours PRN        12/24/21 1231              Janell QuietConklin, Kaeo Jacome R, PA-C 12/27/21 1912    Vanetta MuldersZackowski, Scott, MD 01/01/22 [phone]

## 2021-12-24 NOTE — ED Triage Notes (Addendum)
From home, 4-5 days ago began having left forearm pain, getting worse, denies any known injury to the area.  Off medications for past 6 weeks due to change in doctor. Has appointment next week.  Full motor function  to left arm, applying ice several days, taking tylenol as needed

## 2021-12-24 NOTE — ED Notes (Signed)
Pt ambulate to RR and back to bed

## 2021-12-24 NOTE — Progress Notes (Signed)
Orthopedic Tech Progress Note Patient Details:  Phyllis Miller 03/24/1961 [id number]   Ortho Devices Type of Ortho Device: Sling immobilizer, Velcro wrist splint Ortho Device/Splint Location: LUE Ortho Device/Splint Interventions: Ordered, Application, Adjustment   Post Interventions Patient Tolerated: Fair Instructions Provided: Care of device, Adjustment of device  [name] 12/24/2021, 1:13 PM

## 2021-12-24 NOTE — ED Notes (Signed)
Ortho tech called for splint application 

## 2022-03-07 ENCOUNTER — Emergency Department (HOSPITAL_COMMUNITY): Payer: Medicaid Other

## 2022-03-07 ENCOUNTER — Emergency Department (HOSPITAL_COMMUNITY)
Admission: EM | Admit: 2022-03-07 | Discharge: 2022-03-07 | Discharge status: Against Medical Advice | Payer: Medicaid Other | Attending: Emergency Medicine | Admitting: Emergency Medicine

## 2022-03-07 ENCOUNTER — Other Ambulatory Visit: Payer: Self-pay

## 2022-03-07 ENCOUNTER — Encounter (HOSPITAL_COMMUNITY): Payer: Self-pay

## 2022-03-07 DIAGNOSIS — Z7982 Long term (current) use of aspirin: Secondary | ICD-10-CM | POA: Insufficient documentation

## 2022-03-07 DIAGNOSIS — R519 Headache, unspecified: Secondary | ICD-10-CM | POA: Insufficient documentation

## 2022-03-07 DIAGNOSIS — Z79899 Other long term (current) drug therapy: Secondary | ICD-10-CM | POA: Diagnosis not present

## 2022-03-07 DIAGNOSIS — N189 Chronic kidney disease, unspecified: Secondary | ICD-10-CM | POA: Insufficient documentation

## 2022-03-07 DIAGNOSIS — R69 Illness, unspecified: Secondary | ICD-10-CM | POA: Insufficient documentation

## 2022-03-07 LAB — BASIC METABOLIC PANEL
Anion gap: 8 (ref 5–15)
BUN: 44 mg/dL — ABNORMAL HIGH (ref 6–20)
CO2: 22 mmol/L (ref 22–32)
Calcium: 9.3 mg/dL (ref 8.9–10.3)
Chloride: 113 mmol/L — ABNORMAL HIGH (ref 98–111)
Creatinine, Ser: 3.19 mg/dL — ABNORMAL HIGH (ref 0.44–1.00)
GFR, Estimated: 16 mL/min — ABNORMAL LOW (ref >60)
Glucose, Bld: 101 mg/dL — ABNORMAL HIGH (ref 70–99)
Potassium: 4.7 mmol/L (ref 3.5–5.1)
Sodium: 143 mmol/L (ref 135–145)

## 2022-03-07 LAB — CBC WITH DIFFERENTIAL/PLATELET
Abs Immature Granulocytes: 0.03 10*3/uL (ref 0.00–0.07)
Basophils Absolute: 0 10*3/uL (ref 0.0–0.1)
Basophils Relative: 0 %
Eosinophils Absolute: 0.4 10*3/uL (ref 0.0–0.5)
Eosinophils Relative: 5 %
HCT: 41.2 % (ref 36.0–46.0)
Hemoglobin: 13 g/dL (ref 12.0–15.0)
Immature Granulocytes: 0 %
Lymphocytes Relative: 29 %
Lymphs Abs: 2.8 10*3/uL (ref 0.7–4.0)
MCH: 27.5 pg (ref 26.0–34.0)
MCHC: 31.6 g/dL (ref 30.0–36.0)
MCV: 87.3 fL (ref 80.0–100.0)
Monocytes Absolute: 0.8 10*3/uL (ref 0.1–1.0)
Monocytes Relative: 8 %
Neutro Abs: 5.5 10*3/uL (ref 1.7–7.7)
Neutrophils Relative %: 58 %
Platelets: 263 10*3/uL (ref 150–400)
RBC: 4.72 MIL/uL (ref 3.87–5.11)
RDW: 14.7 % (ref 11.5–15.5)
WBC: 9.6 10*3/uL (ref 4.0–10.5)
nRBC: 0 % (ref 0.0–0.2)

## 2022-03-07 LAB — URINALYSIS, ROUTINE W REFLEX MICROSCOPIC
Bilirubin Urine: NEGATIVE
Glucose, UA: NEGATIVE mg/dL
Hgb urine dipstick: NEGATIVE
Ketones, ur: NEGATIVE mg/dL
Leukocytes,Ua: NEGATIVE
Nitrite: NEGATIVE
Protein, ur: 100 mg/dL — AB
Specific Gravity, Urine: 1.008 (ref 1.005–1.030)
pH: 6 (ref 5.0–8.0)

## 2022-03-07 MED ORDER — ACETAMINOPHEN 500 MG PO TABS: 1000.0000 mg | ORAL_TABLET | Freq: Once | ORAL | Status: DC

## 2022-03-07 NOTE — ED Provider Triage Note (Signed)
Emergency Medicine Provider Triage Evaluation Note ? ?Phyllis Miller , a 61 y.o. female  was evaluated in triage.  Pt complains of sudden onset severe headache yesterday.  Patient states that she was sitting at home and it came on suddenly and severely and is described as the worst headache of her life.  Pain has been constant since then.  No previous history of stroke, aneurysm or bleed.  She denies vision changes, neck pain, chest pain, shortness of breath, nausea, vomiting and diarrhea.  Additionally, she notes that her left leg has been swollen and painful without known trauma or injury ? ?Review of Systems  ?Positive:  ?Negative: As above ? ?Physical Exam  ?BP (!) 142/77   Pulse 77   Temp 98.7 ?F (37.1 ?C) (Oral)   Resp 18   Ht 5\' 1"  (1.549 m)   Wt (!) 143.8 kg   SpO2 98% Comment: Simultaneous filing. User may not have seen previous data.  BMI 59.90 kg/m?  ?Gen:   Awake, no distress   ?Resp:  Normal effort  ?MSK:   Moves extremities without difficulty  ?Other:  Eyes PERRL, EOM intact, scalp atraumatic ? ?Medical Decision Making  ?Medically screening exam initiated at 3:56 PM.  Appropriate orders placed.  Wania Kutner was informed that the remainder of the evaluation will be completed by another provider, this initial triage assessment does not replace that evaluation, and the importance of remaining in the ED until their evaluation is complete. ? ? ?  ?Tonye Pearson, [place] ?03/07/22 1558 ? ?

## 2022-03-07 NOTE — ED Provider Notes (Signed)
?[facility] EMERGENCY DEPARTMENT ?Provider Note ? ? ?CSN: [id number] ?Arrival date & time: 03/07/22  1434 ? ?  ? ?History ? ?Chief Complaint  ?Patient presents with  ? Headache  ? ? ?Phyllis Miller is a 61 y.o. female. ? ?61 year old female with prior medical history as detailed below presents for evaluation.  Patient reports onset of "headache" on Sunday.  Patient denies that there is actual pain associated with this "headache".  Patient is concerned that there "something might be wrong with her." ? ?She denies current pain.  She denies shortness of breath.  She denies recent fever or other symptoms. ?She appears to be comfortable.  She is without acute complaint at this time. ? ? ?The history is provided by the patient and medical records.  ?Illness ?Location:  Feeling "unwell" ?Severity:  Mild ?Onset quality:  Gradual ?Duration:  3 days ?Timing:  Constant ?Progression:  Waxing and waning ?Chronicity:  New ? ?  ? ?Home Medications ?Prior to Admission medications   ?Medication Sig Start Date End Date Taking? Authorizing Provider  ?albuterol (VENTOLIN HFA) 108 (90 Base) MCG/ACT inhaler Inhale 2 puffs into the lungs every 6 (six) hours as needed for wheezing or shortness of breath.  02/29/16 02/03/21  [provider]  ?amLODipine (NORVASC) 10 MG tablet Take 10 mg by mouth daily. 05/03/16 02/03/21  [provider]  ?aspirin EC 81 MG tablet Take 81 mg by mouth daily.     [provider]  ?atorvastatin (LIPITOR) 80 MG tablet Take 80 mg by mouth daily. ?Patient not taking: Reported on 02/03/2021    [provider]  ?buPROPion (WELLBUTRIN XL) 150 MG 24 hr tablet Take 1 tablet (150 mg total) by mouth daily. ?Patient taking differently: Take 300 mg by mouth daily. 07/12/20   Drema Dallas, MD  ?citalopram (CELEXA) 40 MG tablet Take 40 mg by mouth daily.  08/26/10   [provider]  ?colchicine 0.6 MG tablet Take 1 tablet (0.6 mg total) by mouth daily. 12/24/21 01/23/22   Janell Quiet, PA-C  ?enalapril (VASOTEC) 10 MG tablet Take 10 mg by mouth daily.  ?Patient not taking: Reported on 02/03/2021 04/13/16   [provider]  ?famotidine (PEPCID) 20 MG tablet Take 20 mg by mouth daily.  08/20/12   [provider]  ?hydrOXYzine (ATARAX/VISTARIL) 25 MG tablet Take 25 mg by mouth 3 (three) times daily as needed for anxiety or itching. 11/05/20   [provider]  ?methimazole (TAPAZOLE) 5 MG tablet Take 5 mg by mouth daily. 05/19/20   [provider]  ?metoprolol tartrate (LOPRESSOR) 50 MG tablet Take 1 tablet (50 mg total) by mouth 2 (two) times daily. ?Patient taking differently: Take 100 mg by mouth 2 (two) times daily. 07/11/20   Drema Dallas, MD  ?omeprazole (PRILOSEC) 20 MG capsule Take 1 capsule (20 mg total) by mouth 2 (two) times daily before a meal. ?Patient taking differently: Take 20 mg by mouth daily. 03/28/20   Fayrene Helper, PA-C  ?oxyCODONE-acetaminophen (PERCOCET/ROXICET) 5-325 MG tablet Take 1 tablet by mouth every 6 (six) hours as needed for severe pain. 12/24/21   Janell Quiet, PA-C  ?polyethylene glycol (MIRALAX / GLYCOLAX) packet Take 17 g by mouth daily as needed for moderate constipation.  08/30/17   [provider]  ?traZODone (DESYREL) 50 MG tablet Take 0.5 tablets (25 mg total) by mouth at bedtime as needed for sleep. ?Patient not taking: No sig reported 07/11/20   Drema Dallas,  MD  ?   ? ?Allergies    ?Dust mite mixed allergen ext [mite (d. farinae)]   ? ?Review of Systems   ?Review of Systems  ?All other systems reviewed and are negative. ? ?Physical Exam ?Updated Vital Signs ?BP 127/87 (BP Location: Right Wrist)   Pulse 74   Temp (!) 97.5 ?F (36.4 ?C) (Oral)   Resp 15   Ht 5\' 1"  (1.549 m)   Wt (!) 143.8 kg   SpO2 100%   BMI 59.90 kg/m?  ?Physical Exam ?Vitals and nursing note reviewed.  ?Constitutional:   ?   General: She is not in acute distress. ?   Appearance: Normal appearance. She is well-developed.   ?HENT:  ?   Head: Normocephalic and atraumatic.  ?Eyes:  ?   Conjunctiva/sclera: Conjunctivae normal.  ?   Pupils: Pupils are equal, round, and reactive to light.  ?Cardiovascular:  ?   Rate and Rhythm: Normal rate and regular rhythm.  ?   Heart sounds: Normal heart sounds.  ?Pulmonary:  ?   Effort: Pulmonary effort is normal. No respiratory distress.  ?   Breath sounds: Normal breath sounds.  ?Abdominal:  ?   General: There is no distension.  ?   Palpations: Abdomen is soft.  ?   Tenderness: There is no abdominal tenderness.  ?Musculoskeletal:     ?   General: No deformity. Normal range of motion.  ?   Cervical back: Normal range of motion and neck supple.  ?Skin: ?   General: Skin is warm and dry.  ?Neurological:  ?   General: No focal deficit present.  ?   Mental Status: She is alert and oriented to person, place, and time.  ? ? ?ED Results / Procedures / Treatments   ?Labs ?(all labs ordered are listed, but only abnormal results are displayed) ?Labs Reviewed  ?BASIC METABOLIC PANEL - Abnormal; Notable for the following components:  ?    Result Value  ? Chloride 113 (*)   ? Glucose, Bld 101 (*)   ? BUN 44 (*)   ? Creatinine, Ser 3.19 (*)   ? GFR, Estimated 16 (*)   ? All other components within normal limits  ?URINALYSIS, ROUTINE W REFLEX MICROSCOPIC - Abnormal; Notable for the following components:  ? Color, Urine STRAW (*)   ? Protein, ur 100 (*)   ? Bacteria, UA RARE (*)   ? All other components within normal limits  ?CBC WITH DIFFERENTIAL/PLATELET  ? ? ?EKG ?None ? ?Radiology ?CT Head Wo Contrast ? ?Result Date: 03/07/2022 ?CLINICAL DATA:  Headache, sudden, severe EXAM: CT HEAD WITHOUT CONTRAST TECHNIQUE: Contiguous axial images were obtained from the base of the skull through the vertex without intravenous contrast. RADIATION DOSE REDUCTION: This exam was performed according to the departmental dose-optimization program which includes automated exposure control, adjustment of the mA and/or kV according to  patient size and/or use of iterative reconstruction technique. COMPARISON:  None. FINDINGS: Brain: No evidence of large-territorial acute infarction. No parenchymal hemorrhage. No mass lesion. No extra-axial collection. No mass effect or midline shift. No hydrocephalus. Basilar cisterns are patent. Vascular: No hyperdense vessel. Skull: No acute fracture or focal lesion. Sinuses/Orbits: Paranasal sinuses and mastoid air cells are clear. The orbits are unremarkable. Other: None. IMPRESSION: No acute intracranial abnormality. Electronically Signed   By: Tish FredericksonMorgane  Naveau M.D.   On: 03/07/2022 16:11  ? ?DG Knee Complete 4 Views Left ? ?Result Date: 03/07/2022 ?CLINICAL DATA:  Left knee pain and swelling.  EXAM: LEFT KNEE - COMPLETE 4+ VIEW COMPARISON:  None. FINDINGS: No evidence of fracture, dislocation, or joint effusion. Minimal narrowing and osteophyte formation is noted of medial joint space. Soft tissues are unremarkable. IMPRESSION: Minimal medial degenerative joint disease. No acute abnormality seen. Electronically Signed   By: Lupita Raider M.D.   On: 03/07/2022 16:16   ? ?Procedures ?Procedures  ? ? ?Medications Ordered in ED ?Medications  ?acetaminophen (TYLENOL) tablet 1,000 mg (has no administration in time range)  ? ? ?ED Course/ Medical Decision Making/ A&P ?  ?                        ?Medical Decision Making ?Risk ?OTC drugs. ? ? ? ?Medical Screen Complete ? ?This patient presented to the ED with complaint of feeling unwell. ? ?This complaint involves an extensive number of treatment options. The initial differential diagnosis includes, but is not limited to, metabolic abnormality, infection, etc. ? ?This presentation is: Acute, Chronic, Self-Limited, Previously Undiagnosed, Uncertain Prognosis, Complicated, Systemic Symptoms, and Threat to Life/Bodily Function ? ?Patient is presenting with vague complaints.  These complaints appear to have improved significantly over the several days. ? ?She does not have  current specific complaint.  She request that she get "checked out" ? ?Patient with known history of renal insufficiency.  Renal function today is slightly worse than last set of BUN and creatinine.  Patient does have

## 2022-03-07 NOTE — ED Notes (Signed)
Pt unable to be found by staff. MD notified.  ?

## 2022-03-07 NOTE — ED Notes (Signed)
Patient transported to CT and then to Xray. Plan is to return back to triage to have labs drawn. CT transporter informed to bring the patient back to triage. ?

## 2022-03-07 NOTE — ED Triage Notes (Addendum)
Pt BIB [facility] from home c/o a migraine that as been constant since Sunday. Pt is alert and oriented at this time. Pt does not haver any unilateral weakness or any other deficits noted. Per the pt it is not a headache pt said she feels weird in her head and doesn't feel right but it is not an actual pain.  ?

## 2022-09-29 ENCOUNTER — Emergency Department (HOSPITAL_COMMUNITY)
Admission: EM | Admit: 2022-09-29 | Discharge: 2022-09-29 | Discharge status: Against Medical Advice | Payer: Medicaid Other | Attending: Emergency Medicine | Admitting: Emergency Medicine

## 2022-09-29 ENCOUNTER — Other Ambulatory Visit: Payer: Self-pay

## 2022-09-29 ENCOUNTER — Encounter (HOSPITAL_COMMUNITY): Payer: Self-pay | Admitting: Emergency Medicine

## 2022-09-29 DIAGNOSIS — R002 Palpitations: Secondary | ICD-10-CM | POA: Diagnosis present

## 2022-09-29 DIAGNOSIS — Z5321 Procedure and treatment not carried out due to patient leaving prior to being seen by health care provider: Secondary | ICD-10-CM | POA: Diagnosis not present

## 2022-09-29 LAB — BASIC METABOLIC PANEL
Anion gap: 8 (ref 5–15)
BUN: 42 mg/dL — ABNORMAL HIGH (ref 6–20)
CO2: 22 mmol/L (ref 22–32)
Calcium: 9.3 mg/dL (ref 8.9–10.3)
Chloride: 109 mmol/L (ref 98–111)
Creatinine, Ser: 3.63 mg/dL — ABNORMAL HIGH (ref 0.44–1.00)
GFR, Estimated: 14 mL/min — ABNORMAL LOW (ref >60)
Glucose, Bld: 115 mg/dL — ABNORMAL HIGH (ref 70–99)
Potassium: 4.8 mmol/L (ref 3.5–5.1)
Sodium: 139 mmol/L (ref 135–145)

## 2022-09-29 LAB — CBC
HCT: 38.5 % (ref 36.0–46.0)
Hemoglobin: 11.9 g/dL — ABNORMAL LOW (ref 12.0–15.0)
MCH: 27.5 pg (ref 26.0–34.0)
MCHC: 30.9 g/dL (ref 30.0–36.0)
MCV: 88.9 fL (ref 80.0–100.0)
Platelets: 316 10*3/uL (ref 150–400)
RBC: 4.33 MIL/uL (ref 3.87–5.11)
RDW: 14.6 % (ref 11.5–15.5)
WBC: 8.2 10*3/uL (ref 4.0–10.5)
nRBC: 0 % (ref 0.0–0.2)

## 2022-09-29 LAB — MAGNESIUM: Magnesium: 2.2 mg/dL (ref 1.7–2.4)

## 2022-09-29 NOTE — ED Notes (Signed)
Called X1 for reassessment , no answer

## 2022-09-29 NOTE — ED Triage Notes (Signed)
Patient is from home, states she feels like her heart is fluttering.  Patient did take metoprolol 50mg  at 1130 last night, which she states it did not help.  Patient is NSR on monitor with EMS.  No shortness of breath, no nausea or vomiting.

## 2022-09-29 NOTE — ED Provider Triage Note (Signed)
Emergency Medicine Provider Triage Evaluation Note  Phyllis Miller , a 61 y.o. female  was evaluated in triage.  Pt complains of palpitations. Patient had onset of symptoms while laying down for bed tonight. Describes symptoms as her heart "fluttering". Has been intermittently compliant with her metoprolol lately; has been preoccupied with her granddaughter who is presently hospitalized in critical condition. No fevers, chest pain, syncope. Hx of Afib, per patient.  Review of Systems  Positive: As above Negative: As above  Physical Exam  BP 136/82 (BP Location: Right Arm)   Pulse 88   Temp 98.5 F (36.9 C) (Oral)   Resp 16   SpO2 97%  Gen:   Awake, no distress   Resp:  Normal effort  MSK:   Moves extremities without difficulty  Other:  Obese AA female.  Medical Decision Making  Medically screening exam initiated at 2:47 AM.  Appropriate orders placed.  Phyllis Miller was informed that the remainder of the evaluation will be completed by another provider, this initial triage assessment does not replace that evaluation, and the importance of remaining in the ED until their evaluation is complete.  Palpitations - pending labs, EKG.   Antonietta Breach, PA-C 09/29/22 [phone]

## 2022-09-29 NOTE — ED Notes (Signed)
Called X1 for reassessment. No answer 

## 2023-01-13 ENCOUNTER — Other Ambulatory Visit (HOSPITAL_BASED_OUTPATIENT_CLINIC_OR_DEPARTMENT_OTHER): Payer: Self-pay

## 2023-01-13 DIAGNOSIS — G4710 Hypersomnia, unspecified: Secondary | ICD-10-CM

## 2023-01-13 DIAGNOSIS — R0683 Snoring: Secondary | ICD-10-CM

## 2023-01-13 DIAGNOSIS — G4733 Obstructive sleep apnea (adult) (pediatric): Secondary | ICD-10-CM

## 2023-02-04 ENCOUNTER — Ambulatory Visit (HOSPITAL_BASED_OUTPATIENT_CLINIC_OR_DEPARTMENT_OTHER): Payer: Medicaid Other | Attending: Internal Medicine | Admitting: Internal Medicine

## 2023-02-04 VITALS — Ht 61.0 in | Wt 315.0 lb

## 2023-02-04 DIAGNOSIS — G4710 Hypersomnia, unspecified: Secondary | ICD-10-CM | POA: Insufficient documentation

## 2023-02-04 DIAGNOSIS — G4733 Obstructive sleep apnea (adult) (pediatric): Secondary | ICD-10-CM | POA: Diagnosis not present

## 2023-02-04 DIAGNOSIS — R0683 Snoring: Secondary | ICD-10-CM

## 2023-02-10 DIAGNOSIS — R0683 Snoring: Secondary | ICD-10-CM | POA: Diagnosis not present

## 2023-02-10 NOTE — Procedures (Signed)
     Patient Name: Phyllis Miller, Phyllis Miller Date: 02/04/2023 Gender: Female D.O.B: 1961-04-07 Age (years): 61 Referring Provider: Elroy Channel Height (inches): 61 Interpreting Physician: Baird Lyons MD, ABSM Weight (lbs): 315 RPSGT: Baxter Flattery BMI: 60 MRN: [id number] Neck Size: 17.00  CLINICAL INFORMATION Sleep Study Type: NPSG Indication for sleep study: Fatigue, Hypertension, Obesity, Snoring, Witnesses Apnea / Gasping During Sleep Epworth Sleepiness Score: 12  SLEEP STUDY TECHNIQUE As per the AASM Manual for the Scoring of Sleep and Associated Events v2.3 (April 2016) with a hypopnea requiring 4% desaturations.  The channels recorded and monitored were frontal, central and occipital EEG, electrooculogram (EOG), submentalis EMG (chin), nasal and oral airflow, thoracic and abdominal wall motion, anterior tibialis EMG, snore microphone, electrocardiogram, and pulse oximetry.  MEDICATIONS Medications self-administered by patient taken the night of the study : none reported  SLEEP ARCHITECTURE The study was initiated at 10:17:29 PM and ended at 4:43:24 AM.  Sleep onset time was 150.4 minutes and the sleep efficiency was 54.7%. The total sleep time was 211 minutes.  Stage REM latency was 41.0 minutes.  The patient spent 2.4% of the night in stage N1 sleep, 80.1% in stage N2 sleep, 0.0% in stage N3 and 17.5% in REM.  Alpha intrusion was absent.  Supine sleep was 0.00%.  RESPIRATORY PARAMETERS The overall apnea/hypopnea index (AHI) was 19.3 per hour. There were 26 total apneas, including 24 obstructive, 1 central and 1 mixed apneas. There were 42 hypopneas and 11 RERAs.  The AHI during Stage REM sleep was 60.0 per hour.  AHI while supine was N/A per hour.  The mean oxygen saturation was 92.4%. The minimum SpO2 during sleep was 82.0%.  moderate snoring was noted during this study.  CARDIAC DATA The 2 lead EKG demonstrated sinus rhythm. The mean heart rate was  75.7 beats per minute. Other EKG findings include: None.  LEG MOVEMENT DATA The total PLMS were 0 with a resulting PLMS index of 0.0. Associated arousal with leg movement index was 0.0 .  IMPRESSIONS - Moderate obstructive sleep apnea occurred during this study (AHI = 19.3/h). - Sleep onset delayed until 01:00. Insufficient early sleep and events to meet protocol requirement for split CPAP titration. - No significant central sleep apnea occurred during this study (CAI = 0.3/h). - Mild oxygen desaturation was noted during this study (Min O2 = 82.0%, Mean 92.4%). - The patient snored with moderate snoring volume. - No cardiac abnormalities were noted during this study. - Clinically significant periodic limb movements did not occur during sleep. No significant associated arousals.  DIAGNOSIS - Obstructive Sleep Apnea (G47.33)  RECOMMENDATIONS - Suggest CPAP titration sleep study or autopap. Other options would be based on clinical judgment. - Be careful with alcohol, sedatives and other CNS depressants that may worsen sleep apnea and disrupt normal sleep architecture. - Sleep hygiene should be reviewed to assess factors that may improve sleep quality. - Weight management and regular exercise should be initiated or continued if appropriate.  [Electronically signed] 02/10/2023 12:21 PM  Baird Lyons MD, [place], American Board of Sleep Medicine NPI: [id number]                        [place], [facility]  ELECTRONICALLY SIGNED ON:  02/10/2023, 12:18 PM [place] PH: [phone]   FX: [phone] [place]
